# Patient Record
Sex: Female | Born: 1950 | Race: White | Hispanic: No | Marital: Married | Smoking: Former smoker
Health system: Southern US, Community
[De-identification: ages and names within clinical notes are randomized; demographics above are authoritative.]

## PROBLEM LIST (undated history)

## (undated) DIAGNOSIS — H25813 Combined forms of age-related cataract, bilateral: Secondary | ICD-10-CM

## (undated) DIAGNOSIS — R5383 Other fatigue: Secondary | ICD-10-CM

## (undated) DIAGNOSIS — R05 Cough: Secondary | ICD-10-CM

## (undated) DIAGNOSIS — H16403 Unspecified corneal neovascularization, bilateral: Secondary | ICD-10-CM

## (undated) DIAGNOSIS — I10 Essential (primary) hypertension: Secondary | ICD-10-CM

## (undated) DIAGNOSIS — H11043 Peripheral pterygium, stationary, bilateral: Secondary | ICD-10-CM

## (undated) DIAGNOSIS — R079 Chest pain, unspecified: Secondary | ICD-10-CM

## (undated) DIAGNOSIS — I7789 Other specified disorders of arteries and arterioles: Secondary | ICD-10-CM

## (undated) HISTORY — DX: Other fatigue: R53.83

## (undated) HISTORY — DX: Essential (primary) hypertension: I10

## (undated) HISTORY — DX: Combined forms of age-related cataract, bilateral: H25.813

## (undated) HISTORY — PX: CARPAL TUNNEL RELEASE: SHX101

## (undated) HISTORY — PX: TONSILLECTOMY: SUR1361

## (undated) HISTORY — DX: Peripheral pterygium, stationary, bilateral: H11.043

## (undated) HISTORY — DX: Chest pain, unspecified: R07.9

## (undated) HISTORY — PX: ABDOMINAL HYSTERECTOMY: SHX81

## (undated) HISTORY — DX: Other specified disorders of arteries and arterioles: I77.89

## (undated) HISTORY — DX: Cough: R05

## (undated) HISTORY — DX: Unspecified corneal neovascularization, bilateral: H16.403

## (undated) HISTORY — PX: SHOULDER SURGERY: SHX246

---

## 1998-07-13 ENCOUNTER — Encounter: Payer: Self-pay | Admitting: *Deleted

## 1998-07-13 ENCOUNTER — Ambulatory Visit (HOSPITAL_COMMUNITY): Admission: RE | Admit: 1998-07-13 | Discharge: 1998-07-13 | Payer: Self-pay | Admitting: *Deleted

## 2001-12-17 ENCOUNTER — Ambulatory Visit (HOSPITAL_BASED_OUTPATIENT_CLINIC_OR_DEPARTMENT_OTHER): Admission: RE | Admit: 2001-12-17 | Discharge: 2001-12-17 | Payer: Self-pay | Admitting: Orthopedic Surgery

## 2005-07-07 ENCOUNTER — Encounter: Admission: RE | Admit: 2005-07-07 | Discharge: 2005-07-07 | Payer: Self-pay | Admitting: Internal Medicine

## 2006-11-21 ENCOUNTER — Ambulatory Visit (HOSPITAL_BASED_OUTPATIENT_CLINIC_OR_DEPARTMENT_OTHER): Admission: RE | Admit: 2006-11-21 | Discharge: 2006-11-21 | Payer: Self-pay | Admitting: Orthopedic Surgery

## 2007-11-22 IMAGING — CT CT CHEST W/O CM
1 of 2 series · 14 of 31 positions shown, 18 images · IV contrast (omnipaque)
Comparison: 04/18/06.

PATIENT NAME:  BECKMANN, JEANCARLO
DOB:  [birth year]SITE MRN:  [id number]               GR MRN:  [id number]    
LOCATION:  [REDACTED]    

REFERRING PHYSICIAN:  DR WENDEE OUT
READ BY:  LOTHARIO BE                                          
cc:  CROW JIM[LIENAD]
CLINICAL DATA: Lymphoma, status post chemotherapy.  Chest pain and cough and weight loss.  
CT CHEST WITH CONTRAST
TECHNIQUE: Multidetector CT imaging of the chest was performed following the standard protocol during bolus administration of 100 mL Omnipaque 300 intravenous contrast. 
There are no previous available for comparison.
TECHNIQUE: Multidetector CT imaging of the abdomen was performed following the standard protocol during bolus administration of intravenous contrast.
TECHNIQUE: Multidetector CT imaging of the pelvis was performed following the standard protocol during bolus administration of intravenous contrast.

[Series 2: — · axial · 0.39mm/px · z∈[+1499,+1629]mm · 14 of 60 slices shown, 18 images]
[im 5/60  mediastinal]
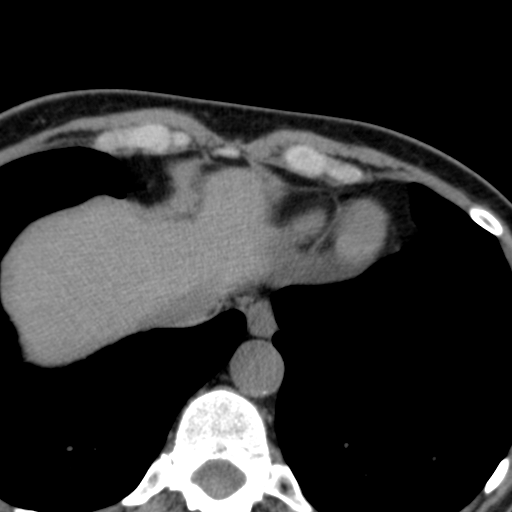
[im 5/60  lung]
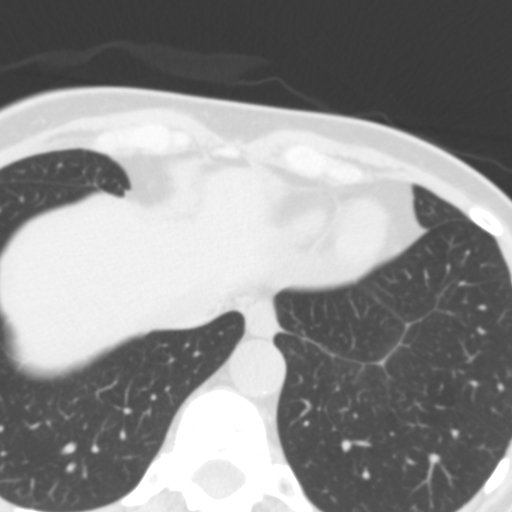
[im 10/60  lung]
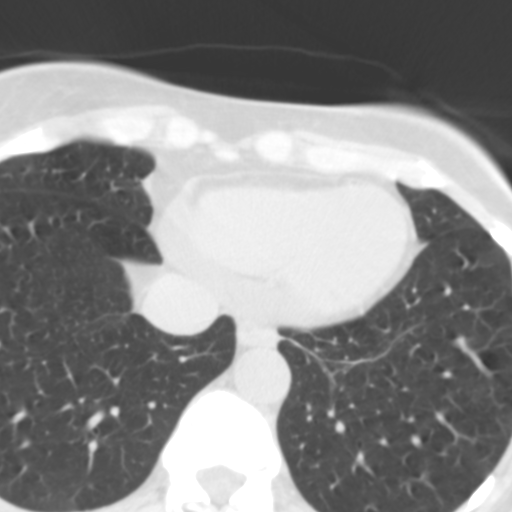
[im 14/60  lung]
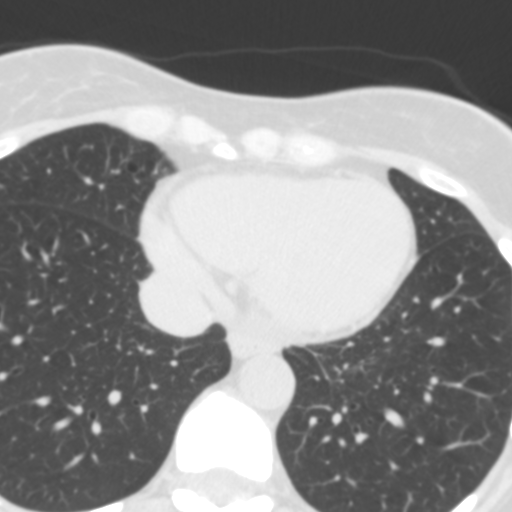
[im 16/60  lung]
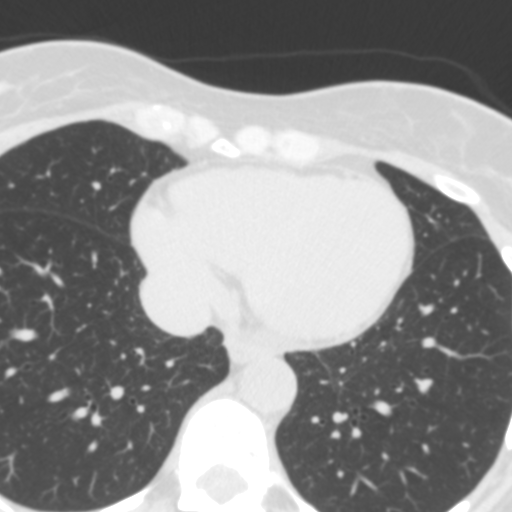
[im 21/60  mediastinal]
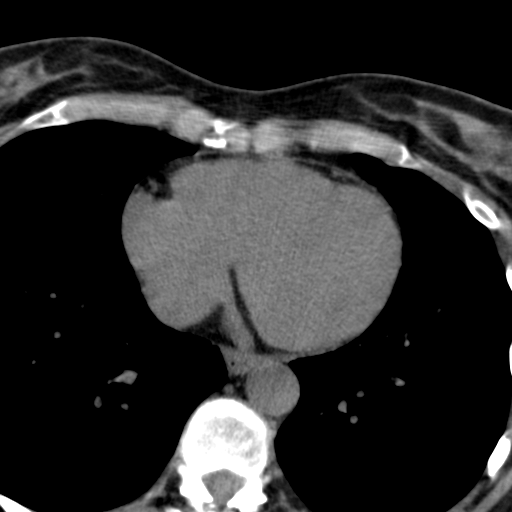
[im 21/60  lung]
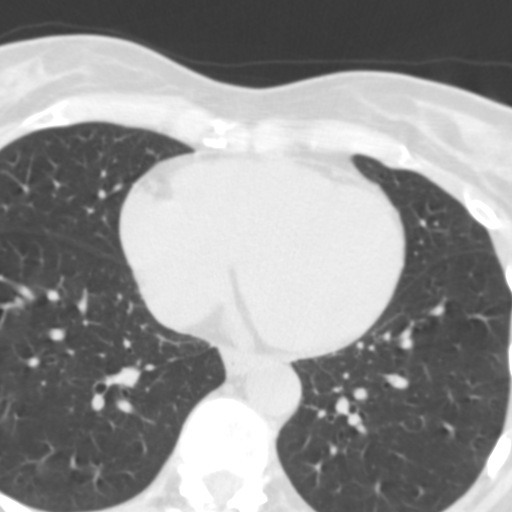
[im 25/60  lung]
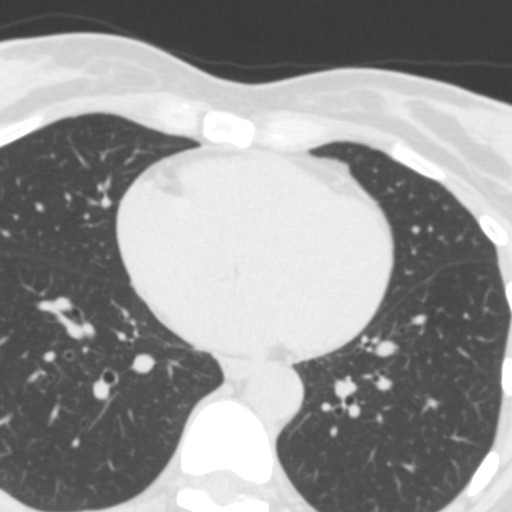
[im 28/60  lung]
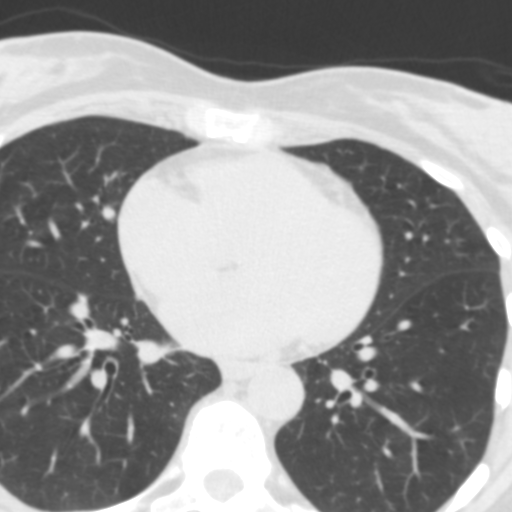
[im 32/60  lung]
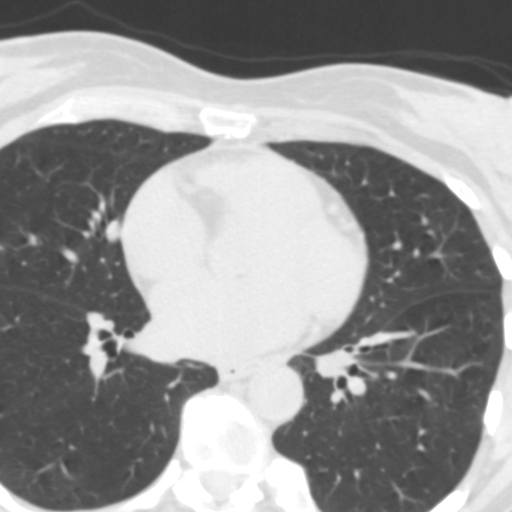
[im 37/60  mediastinal]
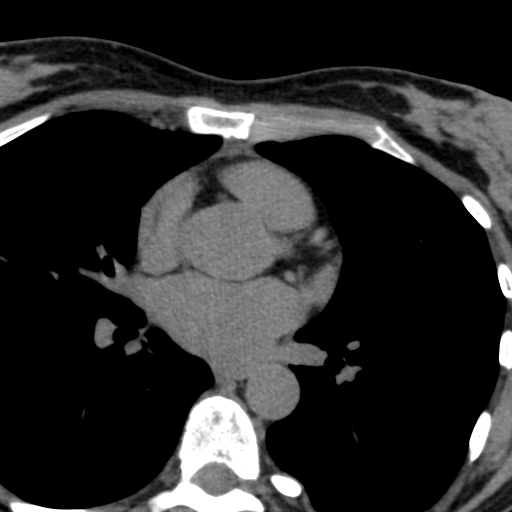
[im 37/60  lung]
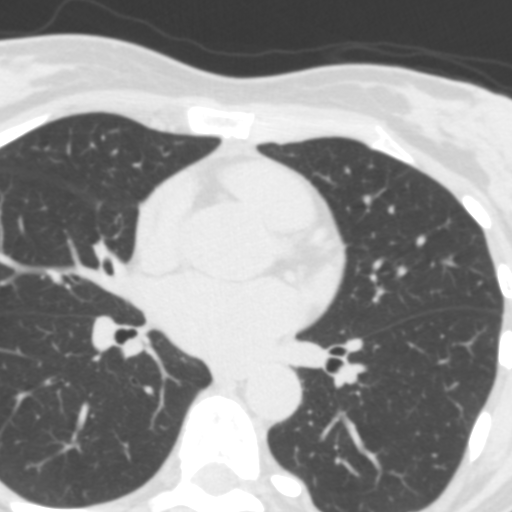
[im 41/60  lung]
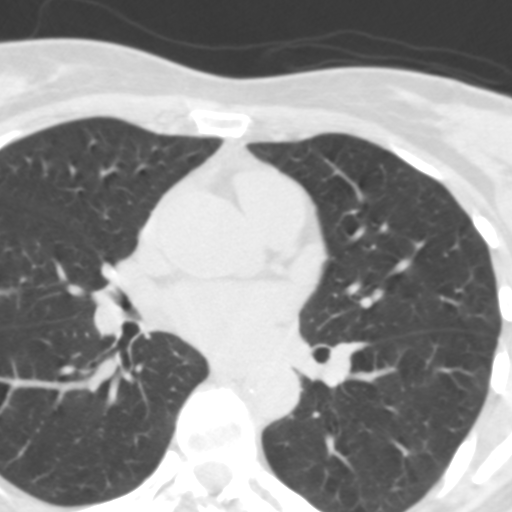
[im 45/60  lung]
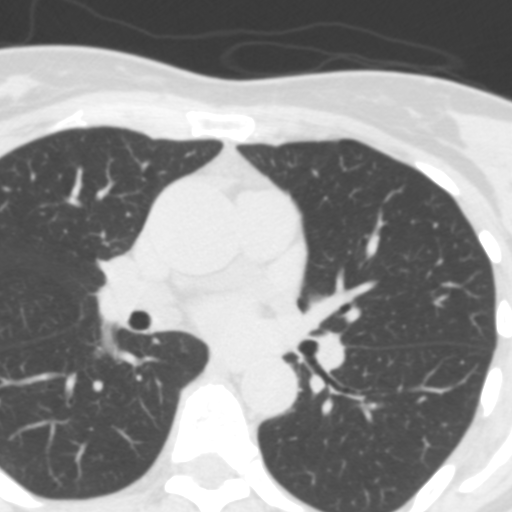
[im 48/60  lung]
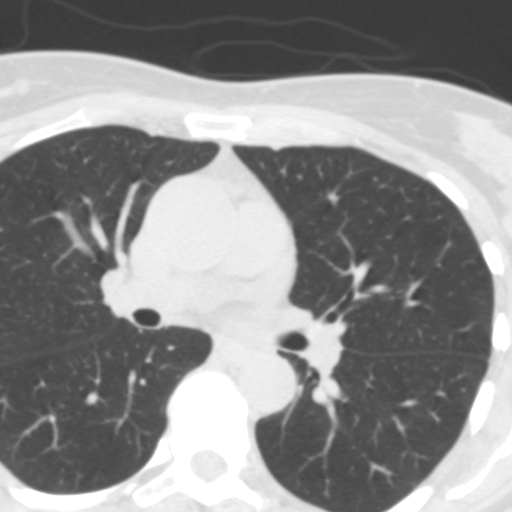
[im 53/60  mediastinal]
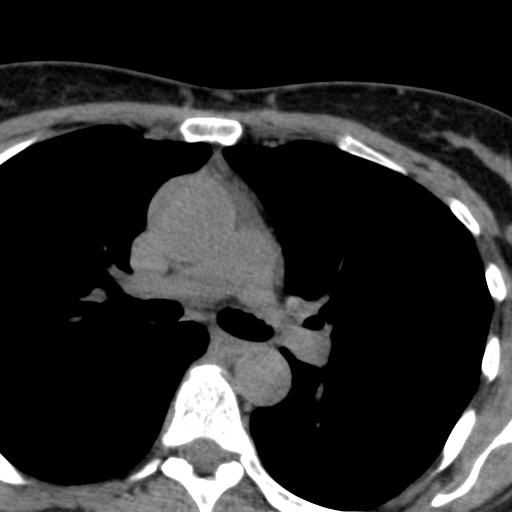
[im 53/60  lung]
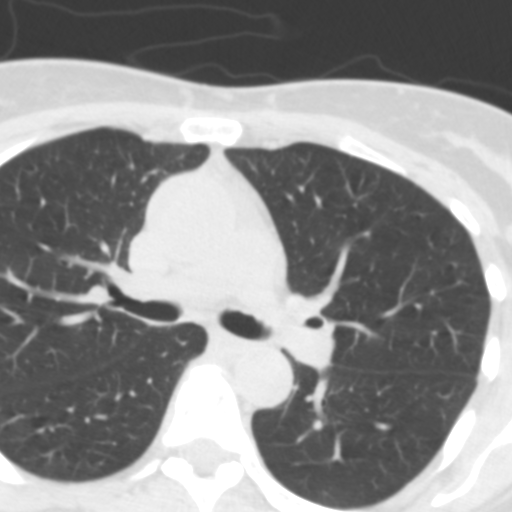
[im 57/60  lung]
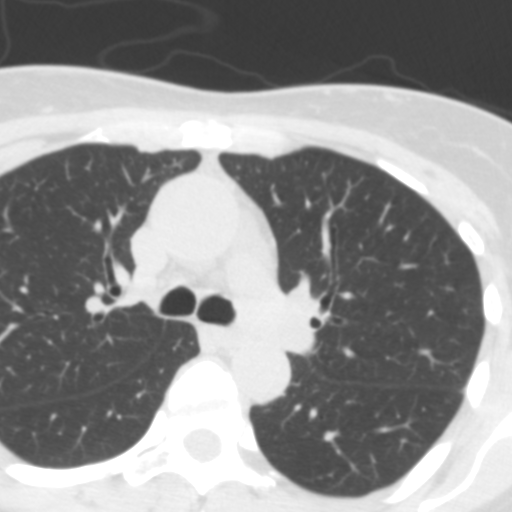

[14 of 31 positions shown; findings below may reference images not displayed]

FINDINGS: No pleural or pericardial effusion.  There is evidence of bilateral central and segmental pulmonary emboli.  Lungs are clear.  No hilar or mediastinal adenopathy.  Right IJ port catheter is in place.  No axillary adenopathy.
IMPRESSION: Central and segmental bilateral pulmonary emboli.  This was telephoned to Dr. [REDACTED] at the time of interpretation.  

CT ABDOMEN WITH CONTRAST
FINDINGS: There is bulky retroperitoneal adenopathy measuring approximately 6.4 x 6.5 cm, slightly decreased from the previous study.  Bulky left para-aortic adenopathy as well as adenopathy around the celiac axis is slightly decreased.  The adenopathy results in mass effect upon the IVC, draping and attenuation of the right renal artery, and narrowing of the central aspect of the left renal vein.  There is probable mural thrombus in the IVC below the level of the renal veins.  Subcentimeter low attenuation lesion in the liver near the falciform ligament is stable.  No new liver lesion.  Changes of cholecystectomy.  Unremarkable spleen, adrenal glands.  Prominent parapelvic left renal cysts again noted.  Small bowel remains decompressed.  No free air.  No ascites.  Portal and splenic veins patent.  There is a 12 x 18 mm soft tissue attenuation nodule in the right posterior perirenal fascial planes which is new since previous study.  Patchy, less well defined soft tissue attenuation is noted inferior to this level, also new since the prior study.
IMPRESSION: 1.Bulky retroperitoneal adenopathy, slightly decreased in overall size since previous scan.  
2.New soft tissue nodules in the right retroperitoneum which may represent progression of disease in this area.  

CT PELVIS WITH CONTRAST
FINDINGS: Left hip prosthesis results in some streak artifact.  Probable non-occlusive thrombus in the right common iliac vein.  This area was not as well opacified on the prior scan.  The right femoral vein is distended with low attenuation centrally, suggesting DVT.  The colon is decompressed, unremarkable.  No free fluid.  Urinary bladder incompletely distended.  Bilateral pelvic phleboliths.
IMPRESSION: 1.No pelvic adenopathy.  Stable appearance since previous scan.  
2.Probable DVT in the right femoral vein and separate thrombus in the right common iliac vein.  
[REDACTED]

[REDACTED]SCRIBED: 07-13-2006 [DATE]

DDH/[REDACTED]

## 2010-08-07 ENCOUNTER — Encounter: Payer: Self-pay | Admitting: Internal Medicine

## 2015-07-28 DIAGNOSIS — K219 Gastro-esophageal reflux disease without esophagitis: Secondary | ICD-10-CM | POA: Diagnosis not present

## 2015-07-28 DIAGNOSIS — R1310 Dysphagia, unspecified: Secondary | ICD-10-CM | POA: Diagnosis not present

## 2015-08-19 DIAGNOSIS — E039 Hypothyroidism, unspecified: Secondary | ICD-10-CM | POA: Diagnosis not present

## 2015-08-19 DIAGNOSIS — K5730 Diverticulosis of large intestine without perforation or abscess without bleeding: Secondary | ICD-10-CM | POA: Diagnosis not present

## 2015-08-19 DIAGNOSIS — Z1211 Encounter for screening for malignant neoplasm of colon: Secondary | ICD-10-CM | POA: Diagnosis not present

## 2015-08-19 DIAGNOSIS — Z800 Family history of malignant neoplasm of digestive organs: Secondary | ICD-10-CM | POA: Diagnosis not present

## 2015-08-19 DIAGNOSIS — R1310 Dysphagia, unspecified: Secondary | ICD-10-CM | POA: Diagnosis not present

## 2015-08-19 DIAGNOSIS — K219 Gastro-esophageal reflux disease without esophagitis: Secondary | ICD-10-CM | POA: Diagnosis not present

## 2015-08-19 DIAGNOSIS — I10 Essential (primary) hypertension: Secondary | ICD-10-CM | POA: Diagnosis not present

## 2015-08-19 DIAGNOSIS — K449 Diaphragmatic hernia without obstruction or gangrene: Secondary | ICD-10-CM | POA: Diagnosis not present

## 2015-08-19 DIAGNOSIS — Z79899 Other long term (current) drug therapy: Secondary | ICD-10-CM | POA: Diagnosis not present

## 2015-08-19 DIAGNOSIS — K208 Other esophagitis: Secondary | ICD-10-CM | POA: Diagnosis not present

## 2015-08-20 DIAGNOSIS — N951 Menopausal and female climacteric states: Secondary | ICD-10-CM | POA: Diagnosis not present

## 2015-09-27 DIAGNOSIS — L821 Other seborrheic keratosis: Secondary | ICD-10-CM | POA: Diagnosis not present

## 2015-09-27 DIAGNOSIS — L661 Lichen planopilaris: Secondary | ICD-10-CM | POA: Diagnosis not present

## 2015-09-27 DIAGNOSIS — L570 Actinic keratosis: Secondary | ICD-10-CM | POA: Diagnosis not present

## 2015-09-27 DIAGNOSIS — Z85828 Personal history of other malignant neoplasm of skin: Secondary | ICD-10-CM | POA: Diagnosis not present

## 2015-09-27 DIAGNOSIS — L814 Other melanin hyperpigmentation: Secondary | ICD-10-CM | POA: Diagnosis not present

## 2015-11-22 DIAGNOSIS — E049 Nontoxic goiter, unspecified: Secondary | ICD-10-CM | POA: Diagnosis not present

## 2015-11-25 DIAGNOSIS — R921 Mammographic calcification found on diagnostic imaging of breast: Secondary | ICD-10-CM | POA: Diagnosis not present

## 2015-11-25 DIAGNOSIS — R928 Other abnormal and inconclusive findings on diagnostic imaging of breast: Secondary | ICD-10-CM | POA: Diagnosis not present

## 2015-12-30 DIAGNOSIS — R079 Chest pain, unspecified: Secondary | ICD-10-CM

## 2015-12-30 DIAGNOSIS — R5383 Other fatigue: Secondary | ICD-10-CM

## 2015-12-30 DIAGNOSIS — R05 Cough: Secondary | ICD-10-CM | POA: Insufficient documentation

## 2015-12-30 DIAGNOSIS — R053 Chronic cough: Secondary | ICD-10-CM

## 2015-12-30 HISTORY — DX: Other fatigue: R53.83

## 2015-12-30 HISTORY — DX: Chronic cough: R05.3

## 2015-12-30 HISTORY — DX: Chest pain, unspecified: R07.9

## 2015-12-31 DIAGNOSIS — R05 Cough: Secondary | ICD-10-CM | POA: Diagnosis not present

## 2015-12-31 DIAGNOSIS — R918 Other nonspecific abnormal finding of lung field: Secondary | ICD-10-CM | POA: Diagnosis not present

## 2016-01-14 DIAGNOSIS — R918 Other nonspecific abnormal finding of lung field: Secondary | ICD-10-CM | POA: Diagnosis not present

## 2016-03-14 DIAGNOSIS — H5213 Myopia, bilateral: Secondary | ICD-10-CM | POA: Diagnosis not present

## 2016-03-14 DIAGNOSIS — H25013 Cortical age-related cataract, bilateral: Secondary | ICD-10-CM | POA: Diagnosis not present

## 2016-03-14 DIAGNOSIS — H11003 Unspecified pterygium of eye, bilateral: Secondary | ICD-10-CM | POA: Diagnosis not present

## 2016-03-29 DIAGNOSIS — Z01419 Encounter for gynecological examination (general) (routine) without abnormal findings: Secondary | ICD-10-CM | POA: Diagnosis not present

## 2016-03-30 DIAGNOSIS — L821 Other seborrheic keratosis: Secondary | ICD-10-CM | POA: Diagnosis not present

## 2016-03-30 DIAGNOSIS — L570 Actinic keratosis: Secondary | ICD-10-CM | POA: Diagnosis not present

## 2016-03-30 DIAGNOSIS — L814 Other melanin hyperpigmentation: Secondary | ICD-10-CM | POA: Diagnosis not present

## 2016-03-30 DIAGNOSIS — Z85828 Personal history of other malignant neoplasm of skin: Secondary | ICD-10-CM | POA: Diagnosis not present

## 2016-03-30 DIAGNOSIS — D485 Neoplasm of uncertain behavior of skin: Secondary | ICD-10-CM | POA: Diagnosis not present

## 2016-03-30 DIAGNOSIS — C4441 Basal cell carcinoma of skin of scalp and neck: Secondary | ICD-10-CM | POA: Diagnosis not present

## 2016-05-02 DIAGNOSIS — Z85828 Personal history of other malignant neoplasm of skin: Secondary | ICD-10-CM | POA: Diagnosis not present

## 2016-05-02 DIAGNOSIS — C44319 Basal cell carcinoma of skin of other parts of face: Secondary | ICD-10-CM | POA: Diagnosis not present

## 2016-05-23 DIAGNOSIS — R921 Mammographic calcification found on diagnostic imaging of breast: Secondary | ICD-10-CM | POA: Diagnosis not present

## 2016-06-12 DIAGNOSIS — R5383 Other fatigue: Secondary | ICD-10-CM | POA: Diagnosis not present

## 2016-06-12 DIAGNOSIS — I10 Essential (primary) hypertension: Secondary | ICD-10-CM | POA: Diagnosis not present

## 2016-06-12 DIAGNOSIS — E782 Mixed hyperlipidemia: Secondary | ICD-10-CM | POA: Diagnosis not present

## 2016-06-12 DIAGNOSIS — N951 Menopausal and female climacteric states: Secondary | ICD-10-CM | POA: Diagnosis not present

## 2016-08-16 DIAGNOSIS — E049 Nontoxic goiter, unspecified: Secondary | ICD-10-CM | POA: Diagnosis not present

## 2016-08-16 DIAGNOSIS — E042 Nontoxic multinodular goiter: Secondary | ICD-10-CM | POA: Diagnosis not present

## 2016-08-30 DIAGNOSIS — I7789 Other specified disorders of arteries and arterioles: Secondary | ICD-10-CM

## 2016-08-30 DIAGNOSIS — I10 Essential (primary) hypertension: Secondary | ICD-10-CM | POA: Insufficient documentation

## 2016-08-30 HISTORY — DX: Essential (primary) hypertension: I10

## 2016-08-30 HISTORY — DX: Other specified disorders of arteries and arterioles: I77.89

## 2016-08-31 DIAGNOSIS — I700 Atherosclerosis of aorta: Secondary | ICD-10-CM | POA: Diagnosis not present

## 2016-08-31 DIAGNOSIS — R079 Chest pain, unspecified: Secondary | ICD-10-CM | POA: Diagnosis not present

## 2016-08-31 DIAGNOSIS — I7789 Other specified disorders of arteries and arterioles: Secondary | ICD-10-CM | POA: Diagnosis not present

## 2016-08-31 DIAGNOSIS — I10 Essential (primary) hypertension: Secondary | ICD-10-CM | POA: Diagnosis not present

## 2016-09-13 DIAGNOSIS — R079 Chest pain, unspecified: Secondary | ICD-10-CM | POA: Diagnosis not present

## 2016-09-13 DIAGNOSIS — I10 Essential (primary) hypertension: Secondary | ICD-10-CM | POA: Diagnosis not present

## 2016-10-05 DIAGNOSIS — E042 Nontoxic multinodular goiter: Secondary | ICD-10-CM | POA: Diagnosis not present

## 2016-10-05 DIAGNOSIS — E782 Mixed hyperlipidemia: Secondary | ICD-10-CM | POA: Diagnosis not present

## 2016-10-05 DIAGNOSIS — D649 Anemia, unspecified: Secondary | ICD-10-CM | POA: Diagnosis not present

## 2016-10-05 DIAGNOSIS — R7309 Other abnormal glucose: Secondary | ICD-10-CM | POA: Diagnosis not present

## 2016-10-05 DIAGNOSIS — R5381 Other malaise: Secondary | ICD-10-CM | POA: Diagnosis not present

## 2016-10-05 DIAGNOSIS — N951 Menopausal and female climacteric states: Secondary | ICD-10-CM | POA: Diagnosis not present

## 2016-10-05 DIAGNOSIS — E039 Hypothyroidism, unspecified: Secondary | ICD-10-CM | POA: Diagnosis not present

## 2016-10-05 DIAGNOSIS — I10 Essential (primary) hypertension: Secondary | ICD-10-CM | POA: Diagnosis not present

## 2016-11-24 DIAGNOSIS — R5381 Other malaise: Secondary | ICD-10-CM | POA: Diagnosis not present

## 2016-11-24 DIAGNOSIS — E039 Hypothyroidism, unspecified: Secondary | ICD-10-CM | POA: Diagnosis not present

## 2017-01-25 DIAGNOSIS — E039 Hypothyroidism, unspecified: Secondary | ICD-10-CM | POA: Diagnosis not present

## 2017-01-25 DIAGNOSIS — E049 Nontoxic goiter, unspecified: Secondary | ICD-10-CM | POA: Diagnosis not present

## 2017-01-25 DIAGNOSIS — D539 Nutritional anemia, unspecified: Secondary | ICD-10-CM | POA: Diagnosis not present

## 2017-03-27 DIAGNOSIS — H2513 Age-related nuclear cataract, bilateral: Secondary | ICD-10-CM | POA: Diagnosis not present

## 2017-03-27 DIAGNOSIS — H5213 Myopia, bilateral: Secondary | ICD-10-CM | POA: Diagnosis not present

## 2017-03-27 DIAGNOSIS — H11003 Unspecified pterygium of eye, bilateral: Secondary | ICD-10-CM | POA: Diagnosis not present

## 2017-04-02 DIAGNOSIS — L821 Other seborrheic keratosis: Secondary | ICD-10-CM | POA: Diagnosis not present

## 2017-04-02 DIAGNOSIS — L570 Actinic keratosis: Secondary | ICD-10-CM | POA: Diagnosis not present

## 2017-04-02 DIAGNOSIS — D485 Neoplasm of uncertain behavior of skin: Secondary | ICD-10-CM | POA: Diagnosis not present

## 2017-04-02 DIAGNOSIS — L814 Other melanin hyperpigmentation: Secondary | ICD-10-CM | POA: Diagnosis not present

## 2017-04-02 DIAGNOSIS — C44212 Basal cell carcinoma of skin of right ear and external auricular canal: Secondary | ICD-10-CM | POA: Diagnosis not present

## 2017-04-02 DIAGNOSIS — Z85828 Personal history of other malignant neoplasm of skin: Secondary | ICD-10-CM | POA: Diagnosis not present

## 2017-04-02 DIAGNOSIS — L820 Inflamed seborrheic keratosis: Secondary | ICD-10-CM | POA: Diagnosis not present

## 2017-04-20 ENCOUNTER — Telehealth: Payer: Self-pay | Admitting: Cardiology

## 2017-04-20 NOTE — Telephone Encounter (Signed)
Patient needs to talk about her bp and the change in her BP meds by her PCP.  Please call her. She was taking Micarditis and now they have swtiched her to Ditalizem and she just wants to make sure that Dr. Bettina Gavia is good with that.

## 2017-04-20 NOTE — Telephone Encounter (Signed)
Spoke with Kathryn Drake and she states she thinks Diltiazem is causing her to cough, and has informed her PCP of this.  PCP advised she should consult with Dr Bettina Gavia.  Dr Bettina Gavia aware of medication changes, and does not believe Diltiazem will cause cough.  Kathryn Drake advised to continue with current medications, and if she feels the need for appt with Dr Bettina Gavia to discuss medications she can do so, but Kathryn Drake states she will see PCP in a few weeks and discuss at that time.    Kathryn Drake will flup with Dr Bettina Gavia in Feb 2019 as planned. Recall was added.  Kathryn Drake verbalized understanding.

## 2017-04-20 NOTE — Telephone Encounter (Signed)
Pt was last seen by you in [place].  Should we just schedule her an appt for our new location to discuss this?  Please advise

## 2017-04-24 DIAGNOSIS — H11043 Peripheral pterygium, stationary, bilateral: Secondary | ICD-10-CM | POA: Diagnosis not present

## 2017-04-24 DIAGNOSIS — H16403 Unspecified corneal neovascularization, bilateral: Secondary | ICD-10-CM | POA: Diagnosis not present

## 2017-04-24 DIAGNOSIS — H25813 Combined forms of age-related cataract, bilateral: Secondary | ICD-10-CM | POA: Diagnosis not present

## 2017-04-25 DIAGNOSIS — H11043 Peripheral pterygium, stationary, bilateral: Secondary | ICD-10-CM

## 2017-04-25 DIAGNOSIS — H25813 Combined forms of age-related cataract, bilateral: Secondary | ICD-10-CM

## 2017-04-25 DIAGNOSIS — H16403 Unspecified corneal neovascularization, bilateral: Secondary | ICD-10-CM

## 2017-04-25 HISTORY — DX: Combined forms of age-related cataract, bilateral: H25.813

## 2017-04-25 HISTORY — DX: Peripheral pterygium, stationary, bilateral: H11.043

## 2017-04-25 HISTORY — DX: Unspecified corneal neovascularization, bilateral: H16.403

## 2017-05-02 DIAGNOSIS — Z85828 Personal history of other malignant neoplasm of skin: Secondary | ICD-10-CM | POA: Diagnosis not present

## 2017-05-02 DIAGNOSIS — C44212 Basal cell carcinoma of skin of right ear and external auricular canal: Secondary | ICD-10-CM | POA: Diagnosis not present

## 2017-05-25 DIAGNOSIS — E782 Mixed hyperlipidemia: Secondary | ICD-10-CM | POA: Diagnosis not present

## 2017-05-25 DIAGNOSIS — I10 Essential (primary) hypertension: Secondary | ICD-10-CM | POA: Diagnosis not present

## 2017-05-25 DIAGNOSIS — E049 Nontoxic goiter, unspecified: Secondary | ICD-10-CM | POA: Diagnosis not present

## 2017-05-25 DIAGNOSIS — N951 Menopausal and female climacteric states: Secondary | ICD-10-CM | POA: Diagnosis not present

## 2017-05-28 ENCOUNTER — Other Ambulatory Visit: Payer: Self-pay

## 2017-05-31 ENCOUNTER — Ambulatory Visit (INDEPENDENT_AMBULATORY_CARE_PROVIDER_SITE_OTHER): Payer: PPO | Admitting: Cardiology

## 2017-05-31 ENCOUNTER — Encounter: Payer: Self-pay | Admitting: Cardiology

## 2017-05-31 VITALS — BP 150/92 | HR 92 | Ht 61.0 in | Wt 110.0 lb

## 2017-05-31 DIAGNOSIS — I10 Essential (primary) hypertension: Secondary | ICD-10-CM | POA: Diagnosis not present

## 2017-05-31 MED ORDER — SPIRONOLACTONE 25 MG PO TABS: 25.0000 mg | ORAL_TABLET | Freq: Every day | ORAL | 3 refills | Status: DC

## 2017-05-31 NOTE — Progress Notes (Signed)
Cardiology Office Note:    Date:  05/31/2017   ID:  Kathryn Drake, DOB [birth year], MRN [id number]  PCP:  Karle Starch, MD  Cardiologist:  Shirlee More, MD    Referring MD: Karle Starch    ASSESSMENT:    1. Essential hypertension    PLAN:    In order of problems listed above:  1. Unfortunately not well controlled and intolerant of ARB with the unusual side effect of cough. I asked her to stop her ARB and will place her on spironolactone as an antihypertensive. 1 week she'll check renal function regarding hyperkalemia. She'll call my office in 3 weeks if systolics are less than 7:90 I think she could use monotherapy and stop the calcium channel blocker which has not been very effective. If she reaches target and tolerates a single agent I'll see back in my office as needed.   Next appointment: As needed   Medication Adjustments/Labs and Tests Ordered: Current medicines are reviewed at length with the patient today.  Concerns regarding medicines are outlined above.  No orders of the defined types were placed in this encounter.  No orders of the defined types were placed in this encounter.   Chief Complaint  Patient presents with  . Follow-up    questions about BP   . Hypertension    and side effects of her BP medication    History of Present Illness:    Kathryn Drake is a 66 y.o. female with a hx of HTN, Chest Pain, and an abnormal CT of the chest in 2013 mild enlargement of the ascending aorta 38 mm subsequently normal on FU CT in 2017  last seen in February 2018. Compliance with diet, lifestyle and medications: Yes Stewart is frustrated because her blood pressure is requiring 2 medications for control. She is taken ARB and unfortunately has developed a cough which isn't uncommon side effect. She is placed on a calcium channel blocker and she has not reached target. I reviewed with her that increasing frequency of hypertension with aging and stiff  vasculature and that she has essential hypertension. She accepts the need for treatment. She would prefer not to take beta blocker and ace and arm are no longer options. Often individuals responsive presently well to blockade a mineralocorticoid hormone and I'll place her on spironolactone 25 mg daily will check renal function one week for potassium and I asked her to call my office in 3 weeks with results. For reproducibility should check her blood pressure at rest once daily and for systolics are less than 2:40 and think she will be able to stop the calcium channel blocker. She is adding salt to her diet I asked her to stop she is not overweight and she'll continue activity. I've left things that if her blood pressures not control of her back to my office and otherwise she'll follow-up with her PCP that she has a great deal of confidence with. She requested the beginning of the visit just to speak to me and she was not examined more than to check her blood pressure. Past Medical History:  Diagnosis Date  . Chest pain 12/30/2015  . Chronic cough 12/30/2015  . 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 Enlarged thoracic aorta ([place]) 08/30/2016   Overview:  33 mm ascending aorta diameter 12/18/15  . Essential hypertension 08/30/2016  . Fatigue 12/30/2015  . Pterygium, peripheral stationary, bilateral 04/25/2017  Past Surgical History:  Procedure Laterality Date  . ABDOMINAL HYSTERECTOMY    . CARPAL TUNNEL RELEASE    . SHOULDER SURGERY    . TONSILLECTOMY      Current Medications: Current Meds  Medication Sig  . telmisartan (MICARDIS) 20 MG tablet Take 1 tablet daily by mouth.  . thyroid (ARMOUR THYROID) 60 MG tablet Take 1 tablet daily by mouth.     Allergies:   Gluten meal   Social History   Socioeconomic History  . Marital status: Married    Spouse name: Not on file  . Number of children: Not on file  . Years of  education: Not on file  . Highest education level: Not on file  Social Needs  . Financial resource strain: Not on file  . Food insecurity - worry: Not on file  . Food insecurity - inability: Not on file  . Transportation needs - medical: Not on file  . Transportation needs - non-medical: Not on file  Occupational History  . Not on file  Tobacco Use  . Smoking status: Former Research scientist (life sciences)  . Smokeless tobacco: Never Used  Substance and Sexual Activity  . Alcohol use: Yes  . Drug use: No  . Sexual activity: Not on file  Other Topics Concern  . Not on file  Social History Narrative  . Not on file     Family History: The patient's family history includes Diabetes in her father; Heart attack in her father and paternal grandfather. ROS:   Please see the history of present illness.    All other systems reviewed and are negative.  EKGs/Labs/Other Studies Reviewed:    The following studies were reviewed today:   Recent Labs: No results found for requested labs within last 8760 hours.  Recent Lipid Panel No results found for: CHOL, TRIG, HDL, CHOLHDL, VLDL, LDLCALC, LDLDIRECT  Physical Exam:    VS:  There were no vitals taken for this visit.    Wt Readings from Last 3 Encounters:  No data found for Wt     GEN:  Well nourished, well developed in no acute distress NEUROLOGIC:  Alert and oriented x 3 PSYCHIATRIC:  Normal affect    Signed, Shirlee More, MD  05/31/2017 3:25 PM    [facility]

## 2017-05-31 NOTE — Patient Instructions (Signed)
Medication Instructions:  Your physician has recommended you make the following change in your medication:  STOP telmisartan START spirinolactone 25 mg daily  Labwork: Your physician recommends that you return for lab work in: 1 week, BMP, [place]  Testing/Procedures: None  Follow-Up: Your physician recommends that you schedule a follow-up appointment as needed if symptoms worsen or fail to improve.  Any Other Special Instructions Will Be Listed Below (If Applicable).     If you need a refill on your cardiac medications before your next appointment, please call your pharmacy.

## 2017-06-13 ENCOUNTER — Telehealth: Payer: Self-pay

## 2017-06-13 MED ORDER — DILTIAZEM HCL ER COATED BEADS 180 MG PO CP24: 180.0000 mg | ORAL_CAPSULE | Freq: Every day | ORAL | 0 refills | Status: DC

## 2017-06-13 NOTE — Telephone Encounter (Signed)
Unfortunately it will recur as they are the same chemical class. Lets add hydralazine 12.5 mg TID

## 2017-06-13 NOTE — Telephone Encounter (Signed)
Left message to return call 

## 2017-06-13 NOTE — Telephone Encounter (Signed)
Left message to return call with husband regarding if patient went to [facility] for BMP.

## 2017-06-13 NOTE — Telephone Encounter (Signed)
Patient states she is taking diltiazem 180 mg daily and spironolactone 25 mg daily. BP 150's about every day for the past few days.   11/26 6:45 am 156/102 Pulse: 93  7:15 am 154/103 Pulse: 84 10 pm 156/96 Pulse: 79 11/27 157/108 Pulse: 90  Patient was on telmisartan, caused a cough. Patient wondering about losartan. She knows some people that take losartan and have had success. She discussed it with her PCP and PCP stated to consult with Cardiology and if recommended take the lowest dose.  Patient is going to have lab work at [facility] in [place] tomorrow morning.  Please advise if patient could start losartan to help with blood pressure.

## 2017-06-14 MED ORDER — HYDRALAZINE HCL 25 MG PO TABS: 12.5000 mg | ORAL_TABLET | Freq: Three times a day (TID) | ORAL | 3 refills | Status: DC

## 2017-06-14 NOTE — Addendum Note (Signed)
Addended by: Jossie Ng on: 06/14/2017 08:44 AM   Modules accepted: Orders

## 2017-06-14 NOTE — Telephone Encounter (Signed)
Patient advised that losartan may cause the cough as telmisartan did due to the same chemical class. Patient advised to start hydralazine 12.5 mg three times daily. Patient verbalized understanding. Prescription sent to [facility] in [place] per patient request. No further questions.

## 2017-06-15 DIAGNOSIS — I10 Essential (primary) hypertension: Secondary | ICD-10-CM | POA: Diagnosis not present

## 2017-06-15 LAB — BASIC METABOLIC PANEL
BUN/Creatinine Ratio: 19 (ref 12–28)
BUN: 15 mg/dL (ref 8–27)
CALCIUM: 10.9 mg/dL — AB (ref 8.7–10.3)
CHLORIDE: 101 mmol/L (ref 96–106)
CO2: 24 mmol/L (ref 20–29)
Creatinine, Ser: 0.78 mg/dL (ref 0.57–1.00)
GFR calc non Af Amer: 79 mL/min/{1.73_m2} (ref >59)
GFR, EST AFRICAN AMERICAN: 91 mL/min/{1.73_m2} (ref >59)
Glucose: 114 mg/dL — ABNORMAL HIGH (ref 65–99)
POTASSIUM: 3.9 mmol/L (ref 3.5–5.2)
Sodium: 141 mmol/L (ref 134–144)

## 2017-06-18 ENCOUNTER — Other Ambulatory Visit: Payer: Self-pay | Admitting: Cardiology

## 2017-06-28 ENCOUNTER — Telehealth: Payer: Self-pay

## 2017-06-28 NOTE — Telephone Encounter (Signed)
Contacted patient regarding blood pressures after starting hydralazine 12.5 mg three times daily. Patient states that she started the hydralazine, but it made her feel like she was beginning to get a bladder infection. Patient stopped taking the hydralazine herself. Patient is still taking diltiazem and spironolactone.  Patient states that she does not have her list of blood pressures because she went to her PCP this morning and give the copy to him. Patient states that this morning when she woke up her blood pressure was 140s/100s, but when she left the PCP her blood pressure was 130/72 with a heart rate of 74.  Advised patient of lab work results. Advised patient that calcium is elevated. Advised patient that Dr. Bettina Gavia would like for patient to have another calcium and PTH check. Patient will go to [facility] in [place] tomorrow morning for lab check.  Patient verbalized understanding of lab work tomorrow morning. No further questions.

## 2017-06-30 LAB — PTH, INTACT AND CALCIUM
Calcium: 9.6 mg/dL (ref 8.7–10.3)
PTH: 34 pg/mL (ref 15–65)

## 2017-07-26 DIAGNOSIS — R102 Pelvic and perineal pain: Secondary | ICD-10-CM | POA: Diagnosis not present

## 2017-08-02 DIAGNOSIS — E782 Mixed hyperlipidemia: Secondary | ICD-10-CM | POA: Diagnosis not present

## 2017-08-02 DIAGNOSIS — I10 Essential (primary) hypertension: Secondary | ICD-10-CM | POA: Diagnosis not present

## 2017-08-02 DIAGNOSIS — E049 Nontoxic goiter, unspecified: Secondary | ICD-10-CM | POA: Diagnosis not present

## 2017-08-02 DIAGNOSIS — E039 Hypothyroidism, unspecified: Secondary | ICD-10-CM | POA: Diagnosis not present

## 2017-08-02 DIAGNOSIS — M899 Disorder of bone, unspecified: Secondary | ICD-10-CM | POA: Diagnosis not present

## 2017-08-02 DIAGNOSIS — R5381 Other malaise: Secondary | ICD-10-CM | POA: Diagnosis not present

## 2017-08-02 DIAGNOSIS — N951 Menopausal and female climacteric states: Secondary | ICD-10-CM | POA: Diagnosis not present

## 2017-08-09 DIAGNOSIS — R109 Unspecified abdominal pain: Secondary | ICD-10-CM | POA: Diagnosis not present

## 2017-08-13 DIAGNOSIS — K869 Disease of pancreas, unspecified: Secondary | ICD-10-CM | POA: Diagnosis not present

## 2017-08-14 DIAGNOSIS — K5710 Diverticulosis of small intestine without perforation or abscess without bleeding: Secondary | ICD-10-CM | POA: Diagnosis not present

## 2017-08-14 DIAGNOSIS — K869 Disease of pancreas, unspecified: Secondary | ICD-10-CM | POA: Diagnosis not present

## 2017-09-21 ENCOUNTER — Other Ambulatory Visit: Payer: Self-pay

## 2017-09-21 DIAGNOSIS — I10 Essential (primary) hypertension: Secondary | ICD-10-CM

## 2017-09-21 MED ORDER — SPIRONOLACTONE 25 MG PO TABS: 25.0000 mg | ORAL_TABLET | Freq: Every day | ORAL | 3 refills | Status: DC

## 2017-11-08 DIAGNOSIS — H11043 Peripheral pterygium, stationary, bilateral: Secondary | ICD-10-CM | POA: Diagnosis not present

## 2017-11-08 DIAGNOSIS — H16409 Unspecified corneal neovascularization, unspecified eye: Secondary | ICD-10-CM | POA: Diagnosis not present

## 2017-11-08 DIAGNOSIS — H25813 Combined forms of age-related cataract, bilateral: Secondary | ICD-10-CM | POA: Diagnosis not present

## 2017-11-08 DIAGNOSIS — H16403 Unspecified corneal neovascularization, bilateral: Secondary | ICD-10-CM | POA: Diagnosis not present

## 2017-11-30 DIAGNOSIS — I10 Essential (primary) hypertension: Secondary | ICD-10-CM | POA: Diagnosis not present

## 2017-11-30 DIAGNOSIS — E782 Mixed hyperlipidemia: Secondary | ICD-10-CM | POA: Diagnosis not present

## 2017-11-30 DIAGNOSIS — R5381 Other malaise: Secondary | ICD-10-CM | POA: Diagnosis not present

## 2017-12-27 ENCOUNTER — Telehealth: Payer: Self-pay

## 2017-12-27 DIAGNOSIS — E782 Mixed hyperlipidemia: Secondary | ICD-10-CM

## 2017-12-27 NOTE — Telephone Encounter (Signed)
-----   Message from Richardo Priest, MD sent at 12/27/2017  2:11 PM EDT ----- I would advise a statin, pravastatin 20 mg day and recheck lipid cmp 1 month  OK to wait for office FU

## 2017-12-27 NOTE — Telephone Encounter (Signed)
Left message to return call 

## 2018-01-01 MED ORDER — PRAVASTATIN SODIUM 20 MG PO TABS: 20.0000 mg | ORAL_TABLET | Freq: Every evening | ORAL | 11 refills | Status: DC

## 2018-01-16 ENCOUNTER — Ambulatory Visit: Payer: PPO | Admitting: Cardiology

## 2018-02-01 ENCOUNTER — Encounter: Payer: Self-pay | Admitting: Cardiology

## 2018-02-01 ENCOUNTER — Ambulatory Visit (INDEPENDENT_AMBULATORY_CARE_PROVIDER_SITE_OTHER): Payer: PPO | Admitting: Cardiology

## 2018-02-01 VITALS — BP 142/78 | HR 110 | Ht 61.0 in | Wt 103.6 lb

## 2018-02-01 DIAGNOSIS — E785 Hyperlipidemia, unspecified: Secondary | ICD-10-CM | POA: Insufficient documentation

## 2018-02-01 DIAGNOSIS — I10 Essential (primary) hypertension: Secondary | ICD-10-CM | POA: Diagnosis not present

## 2018-02-01 DIAGNOSIS — E7800 Pure hypercholesterolemia, unspecified: Secondary | ICD-10-CM | POA: Diagnosis not present

## 2018-02-01 HISTORY — DX: Hyperlipidemia, unspecified: E78.5

## 2018-02-01 NOTE — Patient Instructions (Addendum)
Medication Instructions:  Your physician recommends that you continue on your current medications as directed. Please refer to the Current Medication list given to you today.   Labwork: Your physician recommends that you have labs drawn on Apr 16, 2018.   Testing/Procedures: NONE  Follow-Up: Your physician wants you to follow-up in: 3 months.   You will receive a reminder letter in the mail two months in advance. If you don't receive a letter, please call our office to schedule the follow-up appointment.   Any Other Special Instructions Will Be Listed Below (If Applicable).     If you need a refill on your cardiac medications before your next appointment, please call your pharmacy.

## 2018-02-01 NOTE — Progress Notes (Signed)
Cardiology Office Note:    Date:  02/01/2018   ID:  Kathryn Drake, DOB Feb 27, 1951, MRN [id number]  PCP:  Karle Starch, MD  Cardiologist:  Shirlee More, MD    Referring MD: Karle Starch    ASSESSMENT:    1. Pure hypercholesterolemia   2. Essential hypertension    PLAN:    In order of problems listed above:  1. A combination of elevated LDL cholesterol and highest sensitivity CRP are additive and cardiovascular risk after discussion treatment options will use red rice yeast co-Q10 reassess in 3 months regarding the need for additional or prescription statin treatment treatment. 2. Stable continue current treatment diltiazem ARB   Next appointment: 3 months   Medication Adjustments/Labs and Tests Ordered: Current medicines are reviewed at length with the patient today.  Concerns regarding medicines are outlined above.  Orders Placed This Encounter  Procedures  . NMR, lipoprofile  . Lipoprotein A (LPA)  . CRP High sensitivity   No orders of the defined types were placed in this encounter.   No chief complaint on file.   History of Present Illness:    Kathryn Drake is a 67 y.o. female with a hx of hypertension chest pain abnormal CT of the chest 2013 mild enlargement is seen at descending aorta 38 mm subsequent normal on follow-up CT in 2017 last seen 05/31/17.  She is referred after recent laboratory studies show cholesterol 238 HDL 73 LDL 142 and non-HDL 165 Compliance with diet, lifestyle and medications: Yes  Recent labs show both elevated cholesterol LDL and CRP.  I advised her to take pravastatin she researched decided to use over-the-counter red rice yeast and CoQ10.  Although religious plan at the end of 3 months to have advanced lipid profile recheck a high-sensitivity CRP and LP(a) and reassess the efficacy of treatment Past Medical History:  Diagnosis Date  . Chest pain 12/30/2015  . Chronic cough 12/30/2015  . Combined form of senile  cataract of both eyes 04/25/2017  . 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 Enlarged thoracic aorta ([place]) 08/30/2016   Overview:  33 mm ascending aorta diameter 12/18/15  . Essential hypertension 08/30/2016  . Fatigue 12/30/2015  . Pterygium, peripheral stationary, bilateral 04/25/2017    Past Surgical History:  Procedure Laterality Date  . ABDOMINAL HYSTERECTOMY    . CARPAL TUNNEL RELEASE    . SHOULDER SURGERY    . TONSILLECTOMY      Current Medications: Current Meds  Medication Sig  . Ascorbic Acid (VITAMIN C PO) Take by mouth.  . Biotin 5 MG CAPS Take 5 mg daily by mouth.  . co-enzyme Q-10 50 MG capsule Take 50 mg daily by mouth.  . CRANBERRY PO Take 1 tablet daily by mouth.  . diltiazem (CARDIZEM CD) 180 MG 24 hr capsule Take 1 capsule (180 mg total) by mouth daily.  . diphenhydramine-acetaminophen (TYLENOL PM) 25-500 MG TABS tablet Take 1 tablet daily as needed by mouth.  . estradiol (ESTRACE) 1 MG tablet Take 1 mg daily by mouth.  . hydrALAZINE (APRESOLINE) 25 MG tablet Take 0.5 tablets (12.5 mg total) by mouth 3 (three) times daily.  Marland Kitchen losartan (COZAAR) 25 MG tablet Take 25 mg by mouth daily.  Marland Kitchen MAGNESIUM PO Take 30 mg daily by mouth.  . Multiple Vitamins-Minerals (MULTIVITAMIN ADULT PO) Take by mouth.  . omega-3 acid ethyl esters (LOVAZA) 1 g capsule Take 1 g daily by mouth.  . Omega-3 Fatty Acids (FISH OIL) 1000 MG CAPS  Take 1 capsule by mouth daily.  . pantoprazole (PROTONIX) 40 MG tablet Take 40 mg daily by mouth.  . progesterone (PROMETRIUM) 100 MG capsule Take 100 mg daily by mouth.  . Quercetin 250 MG TABS Take 250 mg daily by mouth.  . thyroid (ARMOUR THYROID) 60 MG tablet Take 1 tablet daily by mouth.  . valACYclovir (VALTREX) 1000 MG tablet Take 1 g daily as needed by mouth.     Allergies:   Gluten meal   Social History   Socioeconomic History  . Marital status: Married    Spouse name: Not on file  . Number of children: Not on file  . Years  of education: Not on file  . Highest education level: Not on file  Occupational History  . Not on file  Social Needs  . Financial resource strain: Not on file  . Food insecurity:    Worry: Not on file    Inability: Not on file  . Transportation needs:    Medical: Not on file    Non-medical: Not on file  Tobacco Use  . Smoking status: Former Research scientist (life sciences)  . Smokeless tobacco: Never Used  Substance and Sexual Activity  . Alcohol use: Yes  . Drug use: No  . Sexual activity: Not on file  Lifestyle  . Physical activity:    Days per week: Not on file    Minutes per session: Not on file  . Stress: Not on file  Relationships  . Social connections:    Talks on phone: Not on file    Gets together: Not on file    Attends religious service: Not on file    Active member of club or organization: Not on file    Attends meetings of clubs or organizations: Not on file    Relationship status: Not on file  Other Topics Concern  . Not on file  Social History Narrative  . Not on file     Family History: The patient's family history includes Diabetes in her father; Heart attack in her father and paternal grandfather. ROS:   Please see the history of present illness.    All other systems reviewed and are negative.  EKGs/Labs/Other Studies Reviewed:    The following studies were reviewed today:    Recent Labs: 06/15/2017: BUN 15; Creatinine, Ser 0.78; Potassium 3.9; Sodium 141  Recent Lipid Panel No results found for: CHOL, TRIG, HDL, CHOLHDL, VLDL, LDLCALC, LDLDIRECT  Physical Exam:    VS:  BP (!) 142/78 (BP Location: Right Arm, Patient Position: Sitting, Cuff Size: Normal)   Pulse (!) 110   Ht 5\' 1"  (1.549 m)   Wt 103 lb 9.6 oz (47 kg)   SpO2 98%   BMI 19.58 kg/m     Wt Readings from Last 3 Encounters:  02/01/18 103 lb 9.6 oz (47 kg)  05/31/17 110 lb (49.9 kg)     GEN:  Well nourished, well developed in no acute distress HEENT: Normal NECK: No JVD; No carotid  bruits LYMPHATICS: No lymphadenopathy CARDIAC: RRR, no murmurs, rubs, gallops RESPIRATORY:  Clear to auscultation without rales, wheezing or rhonchi  ABDOMEN: Soft, non-tender, non-distended MUSCULOSKELETAL:  No edema; No deformity  SKIN: Warm and dry NEUROLOGIC:  Alert and oriented x 3 PSYCHIATRIC:  Normal affect    Signed, Shirlee More, MD  02/01/2018 2:13 PM    [facility]

## 2018-02-01 NOTE — Addendum Note (Signed)
Addended by: Stevan Born on: 02/01/2018 02:17 PM   Modules accepted: Orders

## 2018-03-12 DIAGNOSIS — N951 Menopausal and female climacteric states: Secondary | ICD-10-CM | POA: Diagnosis not present

## 2018-03-12 DIAGNOSIS — E049 Nontoxic goiter, unspecified: Secondary | ICD-10-CM | POA: Diagnosis not present

## 2018-03-12 DIAGNOSIS — R7309 Other abnormal glucose: Secondary | ICD-10-CM | POA: Diagnosis not present

## 2018-03-12 DIAGNOSIS — R5381 Other malaise: Secondary | ICD-10-CM | POA: Diagnosis not present

## 2018-03-12 DIAGNOSIS — I10 Essential (primary) hypertension: Secondary | ICD-10-CM | POA: Diagnosis not present

## 2018-04-01 DIAGNOSIS — L570 Actinic keratosis: Secondary | ICD-10-CM | POA: Diagnosis not present

## 2018-04-01 DIAGNOSIS — L821 Other seborrheic keratosis: Secondary | ICD-10-CM | POA: Diagnosis not present

## 2018-04-01 DIAGNOSIS — Z85828 Personal history of other malignant neoplasm of skin: Secondary | ICD-10-CM | POA: Diagnosis not present

## 2018-05-20 DIAGNOSIS — Z6820 Body mass index (BMI) 20.0-20.9, adult: Secondary | ICD-10-CM | POA: Diagnosis not present

## 2018-05-20 DIAGNOSIS — Z1339 Encounter for screening examination for other mental health and behavioral disorders: Secondary | ICD-10-CM | POA: Diagnosis not present

## 2018-05-20 DIAGNOSIS — Z1331 Encounter for screening for depression: Secondary | ICD-10-CM | POA: Diagnosis not present

## 2018-05-20 DIAGNOSIS — Z9181 History of falling: Secondary | ICD-10-CM | POA: Diagnosis not present

## 2018-05-20 DIAGNOSIS — R1084 Generalized abdominal pain: Secondary | ICD-10-CM | POA: Diagnosis not present

## 2018-05-20 DIAGNOSIS — R05 Cough: Secondary | ICD-10-CM | POA: Diagnosis not present

## 2018-05-20 DIAGNOSIS — I10 Essential (primary) hypertension: Secondary | ICD-10-CM | POA: Diagnosis not present

## 2018-05-20 DIAGNOSIS — Z2821 Immunization not carried out because of patient refusal: Secondary | ICD-10-CM | POA: Diagnosis not present

## 2018-05-23 ENCOUNTER — Ambulatory Visit (INDEPENDENT_AMBULATORY_CARE_PROVIDER_SITE_OTHER): Payer: PPO | Admitting: Cardiology

## 2018-05-23 VITALS — BP 142/82 | HR 70 | Ht 61.0 in | Wt 112.4 lb

## 2018-05-23 DIAGNOSIS — E785 Hyperlipidemia, unspecified: Secondary | ICD-10-CM | POA: Diagnosis not present

## 2018-05-23 DIAGNOSIS — I10 Essential (primary) hypertension: Secondary | ICD-10-CM | POA: Diagnosis not present

## 2018-05-23 NOTE — Progress Notes (Signed)
Cardiology Office Note:    Date:  05/23/2018   ID:  Kathryn Drake, DOB [birth year], MRN [id number]  PCP:  Karle Starch, MD  Cardiologist:  Shirlee More, MD    Referring MD: Karle Starch    ASSESSMENT:    1. Essential hypertension   2. Hyperlipidemia, unspecified hyperlipidemia type    PLAN:    In order of problems listed above:  1. Stable home blood pressure target continue low-dose ARB lifestyle modification exercise 2. Compliant with over-the-counter red yeast rice fish oil she has arrangements for outpatient labs to be done and I will add a LP(a) advanced lipid profile lipid profile to her orders.   Next appointment: 24yr   Medication Adjustments/Labs and Tests Ordered: Current medicines are reviewed at length with the patient today.  Concerns regarding medicines are outlined above.  No orders of the defined types were placed in this encounter.  No orders of the defined types were placed in this encounter.   Chief Complaint  Patient presents with  . Hypertension  . Hyperlipidemia    History of Present Illness:    Kathryn Drake is a 67 y.o. female with a hx of hypertension , hyperlipidemia, chest pain with an abnormal CT of the chest 2013 mild enlargement asceending aorta 38 mm subsequent normal on follow-up CT in 2017. She was  last seen 02/01/2018. Compliance with diet, lifestyle and medications: yes  She has had a good year no chest pain dyspnea palpitation or syncope but is followed up with her PCP for cough.  Came red yeast rice fish oil discussed cardiac CT calcium score for risk assessment. Past Medical History:  Diagnosis Date  . Chest pain 12/30/2015  . Chronic cough 12/30/2015  . 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 Enlarged thoracic aorta ([place]) 08/30/2016   Overview:  33 mm ascending aorta diameter 12/18/15  . Essential hypertension 08/30/2016  . Fatigue  12/30/2015  . Pterygium, peripheral stationary, bilateral 04/25/2017    Past Surgical History:  Procedure Laterality Date  . ABDOMINAL HYSTERECTOMY    . CARPAL TUNNEL RELEASE    . SHOULDER SURGERY    . TONSILLECTOMY      Current Medications: Current Meds  Medication Sig  . Ascorbic Acid (VITAMIN C PO) Take by mouth.  . Biotin 5 MG CAPS Take 5 mg daily by mouth.  . co-enzyme Q-10 50 MG capsule Take 50 mg daily by mouth.  . diltiazem (CARDIZEM CD) 180 MG 24 hr capsule Take 1 capsule (180 mg total) by mouth daily.  . diphenhydramine-acetaminophen (TYLENOL PM) 25-500 MG TABS tablet Take 1 tablet daily as needed by mouth.  . estradiol (ESTRACE) 1 MG tablet Take 1 mg daily by mouth.  . losartan (COZAAR) 25 MG tablet Take 25 mg by mouth daily.  Marland Kitchen MAGNESIUM PO Take 30 mg daily by mouth.  . Multiple Vitamins-Minerals (MULTIVITAMIN ADULT PO) Take by mouth.  . Omega-3 Fatty Acids (FISH OIL) 1000 MG CAPS Take 1 capsule by mouth daily.  . pantoprazole (PROTONIX) 40 MG tablet Take 40 mg daily by mouth.  . progesterone (PROMETRIUM) 100 MG capsule Take 100 mg daily by mouth.  . Quercetin 250 MG TABS Take 250 mg daily by mouth.  . thyroid (ARMOUR THYROID) 60 MG tablet Take 1 tablet daily by mouth.  . valACYclovir (VALTREX) 1000 MG tablet Take 1 g daily as needed by mouth.     Allergies:  Gluten meal   Social History   Socioeconomic History  . Marital status: Married    Spouse name: Not on file  . Number of children: Not on file  . Years of education: Not on file  . Highest education level: Not on file  Occupational History  . Not on file  Social Needs  . Financial resource strain: Not on file  . Food insecurity:    Worry: Not on file    Inability: Not on file  . Transportation needs:    Medical: Not on file    Non-medical: Not on file  Tobacco Use  . Smoking status: Former Research scientist (life sciences)  . Smokeless tobacco: Never Used  Substance and Sexual Activity  . Alcohol use: Yes  . Drug use:  No  . Sexual activity: Not on file  Lifestyle  . Physical activity:    Days per week: Not on file    Minutes per session: Not on file  . Stress: Not on file  Relationships  . Social connections:    Talks on phone: Not on file    Gets together: Not on file    Attends religious service: Not on file    Active member of club or organization: Not on file    Attends meetings of clubs or organizations: Not on file    Relationship status: Not on file  Other Topics Concern  . Not on file  Social History Narrative  . Not on file     Family History: The patient's family history includes Diabetes in her father; Heart attack in her father and paternal grandfather. ROS:   Please see the history of present illness.    All other systems reviewed and are negative.  EKGs/Labs/Other Studies Reviewed:    The following studies were reviewed today:  EKG:  EKG ordered today.  The ekg ordered today demonstrates SRTh incomplete RBBB  Recent Labs: 06/15/2017: BUN 15; Creatinine, Ser 0.78; Potassium 3.9; Sodium 141  Recent Lipid Panel No results found for: CHOL, TRIG, HDL, CHOLHDL, VLDL, LDLCALC, LDLDIRECT  Physical Exam:    VS:  BP (!) 142/82 (BP Location: Right Arm, Patient Position: Sitting, Cuff Size: Small)   Pulse 70   Ht 5\' 1"  (1.549 m)   Wt 112 lb 6.4 oz (51 kg)   SpO2 98%   BMI 21.24 kg/m     Wt Readings from Last 3 Encounters:  05/23/18 112 lb 6.4 oz (51 kg)  02/01/18 103 lb 9.6 oz (47 kg)  05/31/17 110 lb (49.9 kg)     GEN:  Well nourished, well developed in no acute distress HEENT: Normal NECK: No JVD; No carotid bruits LYMPHATICS: No lymphadenopathy CARDIAC: RRR, no murmurs, rubs, gallops RESPIRATORY:  Clear to auscultation without rales, wheezing or rhonchi  ABDOMEN: Soft, non-tender, non-distended MUSCULOSKELETAL:  No edema; No deformity  SKIN: Warm and dry NEUROLOGIC:  Alert and oriented x 3 PSYCHIATRIC:  Normal affect    Signed, Shirlee More, MD  05/23/2018  3:49 PM    [facility]

## 2018-05-23 NOTE — Patient Instructions (Signed)
Medication Instructions:  Your physician recommends that you continue on your current medications as directed. Please refer to the Current Medication list given to you today.  If you need a refill on your cardiac medications before your next appointment, please call your pharmacy.   Lab work: Your physician recommends that you return for lab work within the next week: lipoprotein A (LPA), lipid profile, NMR lipoprofile at [facility] in [place]. Please fast beforehand. No appointment needed.   If you have labs (blood work) drawn today and your tests are completely normal, you will receive your results only by: Marland Kitchen MyChart Message (if you have MyChart) OR . A paper copy in the mail If you have any lab test that is abnormal or we need to change your treatment, we will call you to review the results.  Testing/Procedures: None  Follow-Up: At [facility], you and your health needs are our priority.  As part of our continuing mission to provide you with exceptional heart care, we have created designated Provider Care Teams.  These Care Teams include your primary Cardiologist (physician) and Advanced Practice Providers (APPs -  Physician Assistants and Nurse Practitioners) who all work together to provide you with the care you need, when you need it. You will need a follow up appointment in 1 years.  Please call our office 2 months in advance to schedule this appointment.

## 2018-05-28 DIAGNOSIS — R102 Pelvic and perineal pain: Secondary | ICD-10-CM | POA: Diagnosis not present

## 2018-06-12 DIAGNOSIS — E785 Hyperlipidemia, unspecified: Secondary | ICD-10-CM | POA: Diagnosis not present

## 2018-06-12 DIAGNOSIS — R5381 Other malaise: Secondary | ICD-10-CM | POA: Diagnosis not present

## 2018-06-13 LAB — LIPID PANEL
Chol/HDL Ratio: 3.4 ratio (ref 0.0–4.4)
Cholesterol, Total: 275 mg/dL — ABNORMAL HIGH (ref 100–199)
HDL: 82 mg/dL (ref >39)
LDL CALC: 169 mg/dL — AB (ref 0–99)
Triglycerides: 119 mg/dL (ref 0–149)
VLDL CHOLESTEROL CAL: 24 mg/dL (ref 5–40)

## 2018-06-13 LAB — NMR, LIPOPROFILE
CHOLESTEROL, TOTAL: 284 mg/dL — AB (ref 100–199)
HDL Particle Number: 42.6 umol/L (ref >=30.5)
HDL-C: 83 mg/dL (ref >39)
LDL PARTICLE NUMBER: 1711 nmol/L — AB (ref <1000)
LDL Size: 21.4 nm (ref >20.5)
LDL-C: 179 mg/dL — AB (ref 0–99)
Small LDL Particle Number: 477 nmol/L (ref <=527)
TRIGLYCERIDES: 111 mg/dL (ref 0–149)

## 2018-06-13 LAB — LIPOPROTEIN A (LPA): Lipoprotein (a): <8.4 nmol/L (ref <75.0)

## 2018-06-17 ENCOUNTER — Telehealth: Payer: Self-pay | Admitting: *Deleted

## 2018-06-17 NOTE — Telephone Encounter (Signed)
Left message to return call to discuss lab results on patient's home phone per DPR. No answer on cell phone and unable to leave message.

## 2018-06-19 DIAGNOSIS — I10 Essential (primary) hypertension: Secondary | ICD-10-CM | POA: Diagnosis not present

## 2018-06-19 DIAGNOSIS — R300 Dysuria: Secondary | ICD-10-CM | POA: Diagnosis not present

## 2018-06-19 DIAGNOSIS — E039 Hypothyroidism, unspecified: Secondary | ICD-10-CM | POA: Diagnosis not present

## 2018-06-19 DIAGNOSIS — Z0000 Encounter for general adult medical examination without abnormal findings: Secondary | ICD-10-CM | POA: Diagnosis not present

## 2018-06-19 DIAGNOSIS — K219 Gastro-esophageal reflux disease without esophagitis: Secondary | ICD-10-CM | POA: Diagnosis not present

## 2018-06-19 DIAGNOSIS — E785 Hyperlipidemia, unspecified: Secondary | ICD-10-CM | POA: Diagnosis not present

## 2018-07-26 DIAGNOSIS — R300 Dysuria: Secondary | ICD-10-CM | POA: Diagnosis not present

## 2018-07-26 DIAGNOSIS — N393 Stress incontinence (female) (male): Secondary | ICD-10-CM | POA: Diagnosis not present

## 2018-07-29 DIAGNOSIS — I10 Essential (primary) hypertension: Secondary | ICD-10-CM | POA: Diagnosis not present

## 2018-07-29 DIAGNOSIS — R5381 Other malaise: Secondary | ICD-10-CM | POA: Diagnosis not present

## 2018-07-29 DIAGNOSIS — E782 Mixed hyperlipidemia: Secondary | ICD-10-CM | POA: Diagnosis not present

## 2018-07-29 DIAGNOSIS — Z90710 Acquired absence of both cervix and uterus: Secondary | ICD-10-CM | POA: Diagnosis not present

## 2018-07-29 DIAGNOSIS — Z01419 Encounter for gynecological examination (general) (routine) without abnormal findings: Secondary | ICD-10-CM | POA: Diagnosis not present

## 2018-07-29 DIAGNOSIS — N959 Unspecified menopausal and perimenopausal disorder: Secondary | ICD-10-CM | POA: Diagnosis not present

## 2018-11-06 DIAGNOSIS — C44629 Squamous cell carcinoma of skin of left upper limb, including shoulder: Secondary | ICD-10-CM | POA: Diagnosis not present

## 2018-11-06 DIAGNOSIS — C44329 Squamous cell carcinoma of skin of other parts of face: Secondary | ICD-10-CM | POA: Diagnosis not present

## 2018-11-06 DIAGNOSIS — D485 Neoplasm of uncertain behavior of skin: Secondary | ICD-10-CM | POA: Diagnosis not present

## 2018-11-06 DIAGNOSIS — L820 Inflamed seborrheic keratosis: Secondary | ICD-10-CM | POA: Diagnosis not present

## 2018-11-06 DIAGNOSIS — L570 Actinic keratosis: Secondary | ICD-10-CM | POA: Diagnosis not present

## 2018-11-06 DIAGNOSIS — L821 Other seborrheic keratosis: Secondary | ICD-10-CM | POA: Diagnosis not present

## 2018-11-06 DIAGNOSIS — Z85828 Personal history of other malignant neoplasm of skin: Secondary | ICD-10-CM | POA: Diagnosis not present

## 2018-12-10 DIAGNOSIS — R5381 Other malaise: Secondary | ICD-10-CM | POA: Diagnosis not present

## 2018-12-10 DIAGNOSIS — E039 Hypothyroidism, unspecified: Secondary | ICD-10-CM | POA: Diagnosis not present

## 2018-12-10 DIAGNOSIS — I10 Essential (primary) hypertension: Secondary | ICD-10-CM | POA: Diagnosis not present

## 2018-12-10 DIAGNOSIS — N951 Menopausal and female climacteric states: Secondary | ICD-10-CM | POA: Diagnosis not present

## 2018-12-10 DIAGNOSIS — E782 Mixed hyperlipidemia: Secondary | ICD-10-CM | POA: Diagnosis not present

## 2018-12-17 DIAGNOSIS — H16403 Unspecified corneal neovascularization, bilateral: Secondary | ICD-10-CM | POA: Diagnosis not present

## 2018-12-17 DIAGNOSIS — H1840 Unspecified corneal degeneration: Secondary | ICD-10-CM | POA: Diagnosis not present

## 2018-12-17 DIAGNOSIS — H11043 Peripheral pterygium, stationary, bilateral: Secondary | ICD-10-CM | POA: Diagnosis not present

## 2018-12-17 DIAGNOSIS — H25813 Combined forms of age-related cataract, bilateral: Secondary | ICD-10-CM | POA: Diagnosis not present

## 2018-12-18 DIAGNOSIS — C44329 Squamous cell carcinoma of skin of other parts of face: Secondary | ICD-10-CM | POA: Diagnosis not present

## 2018-12-18 DIAGNOSIS — Z85828 Personal history of other malignant neoplasm of skin: Secondary | ICD-10-CM | POA: Diagnosis not present

## 2018-12-31 ENCOUNTER — Other Ambulatory Visit: Payer: Self-pay | Admitting: Family Medicine

## 2018-12-31 DIAGNOSIS — R5381 Other malaise: Secondary | ICD-10-CM

## 2019-01-06 ENCOUNTER — Other Ambulatory Visit: Payer: Self-pay | Admitting: Family Medicine

## 2019-01-06 DIAGNOSIS — M8580 Other specified disorders of bone density and structure, unspecified site: Secondary | ICD-10-CM

## 2019-01-07 ENCOUNTER — Ambulatory Visit
Admission: RE | Admit: 2019-01-07 | Discharge: 2019-01-07 | Disposition: A | Discharge status: Home / Self Care | Payer: PPO | Source: Ambulatory Visit | Attending: Family Medicine | Admitting: Family Medicine

## 2019-01-07 ENCOUNTER — Other Ambulatory Visit: Payer: Self-pay

## 2019-01-07 DIAGNOSIS — M8580 Other specified disorders of bone density and structure, unspecified site: Secondary | ICD-10-CM

## 2019-01-07 DIAGNOSIS — M8589 Other specified disorders of bone density and structure, multiple sites: Secondary | ICD-10-CM | POA: Diagnosis not present

## 2019-02-19 DIAGNOSIS — R195 Other fecal abnormalities: Secondary | ICD-10-CM | POA: Diagnosis not present

## 2019-02-19 DIAGNOSIS — Z6820 Body mass index (BMI) 20.0-20.9, adult: Secondary | ICD-10-CM | POA: Diagnosis not present

## 2019-02-19 DIAGNOSIS — K219 Gastro-esophageal reflux disease without esophagitis: Secondary | ICD-10-CM | POA: Diagnosis not present

## 2019-02-19 DIAGNOSIS — M25511 Pain in right shoulder: Secondary | ICD-10-CM | POA: Diagnosis not present

## 2019-04-16 DIAGNOSIS — R5381 Other malaise: Secondary | ICD-10-CM | POA: Diagnosis not present

## 2019-04-16 DIAGNOSIS — N951 Menopausal and female climacteric states: Secondary | ICD-10-CM | POA: Diagnosis not present

## 2019-04-16 DIAGNOSIS — M899 Disorder of bone, unspecified: Secondary | ICD-10-CM | POA: Diagnosis not present

## 2019-04-16 DIAGNOSIS — I10 Essential (primary) hypertension: Secondary | ICD-10-CM | POA: Diagnosis not present

## 2019-04-16 DIAGNOSIS — E782 Mixed hyperlipidemia: Secondary | ICD-10-CM | POA: Diagnosis not present

## 2019-04-16 DIAGNOSIS — E049 Nontoxic goiter, unspecified: Secondary | ICD-10-CM | POA: Diagnosis not present

## 2019-06-05 DIAGNOSIS — M810 Age-related osteoporosis without current pathological fracture: Secondary | ICD-10-CM | POA: Diagnosis not present

## 2019-06-05 DIAGNOSIS — R110 Nausea: Secondary | ICD-10-CM | POA: Diagnosis not present

## 2019-06-05 DIAGNOSIS — Z2821 Immunization not carried out because of patient refusal: Secondary | ICD-10-CM | POA: Diagnosis not present

## 2019-06-05 DIAGNOSIS — Z9181 History of falling: Secondary | ICD-10-CM | POA: Diagnosis not present

## 2019-06-05 DIAGNOSIS — R030 Elevated blood-pressure reading, without diagnosis of hypertension: Secondary | ICD-10-CM | POA: Diagnosis not present

## 2019-06-05 DIAGNOSIS — Z1331 Encounter for screening for depression: Secondary | ICD-10-CM | POA: Diagnosis not present

## 2019-06-05 DIAGNOSIS — K589 Irritable bowel syndrome without diarrhea: Secondary | ICD-10-CM | POA: Diagnosis not present

## 2019-06-09 ENCOUNTER — Other Ambulatory Visit: Payer: Self-pay

## 2019-06-19 DIAGNOSIS — R110 Nausea: Secondary | ICD-10-CM | POA: Diagnosis not present

## 2019-06-19 DIAGNOSIS — I10 Essential (primary) hypertension: Secondary | ICD-10-CM | POA: Diagnosis not present

## 2019-06-19 DIAGNOSIS — R5381 Other malaise: Secondary | ICD-10-CM | POA: Diagnosis not present

## 2019-06-19 DIAGNOSIS — E782 Mixed hyperlipidemia: Secondary | ICD-10-CM | POA: Diagnosis not present

## 2019-06-23 DIAGNOSIS — I10 Essential (primary) hypertension: Secondary | ICD-10-CM | POA: Diagnosis not present

## 2019-06-23 DIAGNOSIS — Z6820 Body mass index (BMI) 20.0-20.9, adult: Secondary | ICD-10-CM | POA: Diagnosis not present

## 2019-06-23 DIAGNOSIS — E039 Hypothyroidism, unspecified: Secondary | ICD-10-CM | POA: Diagnosis not present

## 2019-06-23 DIAGNOSIS — M810 Age-related osteoporosis without current pathological fracture: Secondary | ICD-10-CM | POA: Diagnosis not present

## 2019-06-23 DIAGNOSIS — F419 Anxiety disorder, unspecified: Secondary | ICD-10-CM | POA: Diagnosis not present

## 2019-07-22 DIAGNOSIS — K219 Gastro-esophageal reflux disease without esophagitis: Secondary | ICD-10-CM | POA: Diagnosis not present

## 2019-07-22 DIAGNOSIS — G4733 Obstructive sleep apnea (adult) (pediatric): Secondary | ICD-10-CM | POA: Diagnosis not present

## 2019-08-05 DIAGNOSIS — N951 Menopausal and female climacteric states: Secondary | ICD-10-CM | POA: Diagnosis not present

## 2019-08-05 DIAGNOSIS — E049 Nontoxic goiter, unspecified: Secondary | ICD-10-CM | POA: Diagnosis not present

## 2019-08-05 DIAGNOSIS — R5381 Other malaise: Secondary | ICD-10-CM | POA: Diagnosis not present

## 2019-08-29 DIAGNOSIS — Z1231 Encounter for screening mammogram for malignant neoplasm of breast: Secondary | ICD-10-CM | POA: Diagnosis not present

## 2019-08-29 DIAGNOSIS — R928 Other abnormal and inconclusive findings on diagnostic imaging of breast: Secondary | ICD-10-CM | POA: Diagnosis not present

## 2019-09-02 DIAGNOSIS — G4733 Obstructive sleep apnea (adult) (pediatric): Secondary | ICD-10-CM | POA: Diagnosis not present

## 2019-09-09 DIAGNOSIS — K219 Gastro-esophageal reflux disease without esophagitis: Secondary | ICD-10-CM | POA: Diagnosis not present

## 2019-09-24 DIAGNOSIS — N6489 Other specified disorders of breast: Secondary | ICD-10-CM | POA: Diagnosis not present

## 2019-09-24 DIAGNOSIS — R920 Mammographic microcalcification found on diagnostic imaging of breast: Secondary | ICD-10-CM | POA: Diagnosis not present

## 2019-09-24 DIAGNOSIS — R921 Mammographic calcification found on diagnostic imaging of breast: Secondary | ICD-10-CM | POA: Diagnosis not present

## 2019-10-31 DIAGNOSIS — N959 Unspecified menopausal and perimenopausal disorder: Secondary | ICD-10-CM | POA: Diagnosis not present

## 2019-10-31 DIAGNOSIS — I10 Essential (primary) hypertension: Secondary | ICD-10-CM | POA: Diagnosis not present

## 2019-10-31 DIAGNOSIS — E042 Nontoxic multinodular goiter: Secondary | ICD-10-CM | POA: Diagnosis not present

## 2019-10-31 DIAGNOSIS — R5381 Other malaise: Secondary | ICD-10-CM | POA: Diagnosis not present

## 2019-10-31 DIAGNOSIS — R7309 Other abnormal glucose: Secondary | ICD-10-CM | POA: Diagnosis not present

## 2019-11-10 DIAGNOSIS — D225 Melanocytic nevi of trunk: Secondary | ICD-10-CM | POA: Diagnosis not present

## 2019-11-10 DIAGNOSIS — D485 Neoplasm of uncertain behavior of skin: Secondary | ICD-10-CM | POA: Diagnosis not present

## 2019-11-10 DIAGNOSIS — L570 Actinic keratosis: Secondary | ICD-10-CM | POA: Diagnosis not present

## 2019-11-10 DIAGNOSIS — Z85828 Personal history of other malignant neoplasm of skin: Secondary | ICD-10-CM | POA: Diagnosis not present

## 2019-11-10 DIAGNOSIS — L820 Inflamed seborrheic keratosis: Secondary | ICD-10-CM | POA: Diagnosis not present

## 2019-11-10 DIAGNOSIS — L821 Other seborrheic keratosis: Secondary | ICD-10-CM | POA: Diagnosis not present

## 2019-11-10 DIAGNOSIS — C44519 Basal cell carcinoma of skin of other part of trunk: Secondary | ICD-10-CM | POA: Diagnosis not present

## 2019-11-10 DIAGNOSIS — D0472 Carcinoma in situ of skin of left lower limb, including hip: Secondary | ICD-10-CM | POA: Diagnosis not present

## 2020-01-05 DIAGNOSIS — C44729 Squamous cell carcinoma of skin of left lower limb, including hip: Secondary | ICD-10-CM | POA: Diagnosis not present

## 2020-01-05 DIAGNOSIS — Z85828 Personal history of other malignant neoplasm of skin: Secondary | ICD-10-CM | POA: Diagnosis not present

## 2020-01-05 DIAGNOSIS — L820 Inflamed seborrheic keratosis: Secondary | ICD-10-CM | POA: Diagnosis not present

## 2020-01-05 DIAGNOSIS — D485 Neoplasm of uncertain behavior of skin: Secondary | ICD-10-CM | POA: Diagnosis not present

## 2020-02-10 ENCOUNTER — Other Ambulatory Visit: Payer: Self-pay

## 2020-05-11 DIAGNOSIS — R928 Other abnormal and inconclusive findings on diagnostic imaging of breast: Secondary | ICD-10-CM | POA: Diagnosis not present

## 2020-05-11 DIAGNOSIS — I10 Essential (primary) hypertension: Secondary | ICD-10-CM | POA: Diagnosis not present

## 2020-05-11 DIAGNOSIS — Z6821 Body mass index (BMI) 21.0-21.9, adult: Secondary | ICD-10-CM | POA: Diagnosis not present

## 2020-05-11 DIAGNOSIS — M810 Age-related osteoporosis without current pathological fracture: Secondary | ICD-10-CM | POA: Diagnosis not present

## 2020-05-12 DIAGNOSIS — L281 Prurigo nodularis: Secondary | ICD-10-CM | POA: Diagnosis not present

## 2020-05-12 DIAGNOSIS — D485 Neoplasm of uncertain behavior of skin: Secondary | ICD-10-CM | POA: Diagnosis not present

## 2020-05-12 DIAGNOSIS — L821 Other seborrheic keratosis: Secondary | ICD-10-CM | POA: Diagnosis not present

## 2020-05-12 DIAGNOSIS — C44722 Squamous cell carcinoma of skin of right lower limb, including hip: Secondary | ICD-10-CM | POA: Diagnosis not present

## 2020-05-12 DIAGNOSIS — Z85828 Personal history of other malignant neoplasm of skin: Secondary | ICD-10-CM | POA: Diagnosis not present

## 2020-05-12 DIAGNOSIS — L570 Actinic keratosis: Secondary | ICD-10-CM | POA: Diagnosis not present

## 2020-05-26 DIAGNOSIS — I10 Essential (primary) hypertension: Secondary | ICD-10-CM | POA: Diagnosis not present

## 2020-05-26 DIAGNOSIS — N959 Unspecified menopausal and perimenopausal disorder: Secondary | ICD-10-CM | POA: Diagnosis not present

## 2020-05-26 DIAGNOSIS — R7309 Other abnormal glucose: Secondary | ICD-10-CM | POA: Diagnosis not present

## 2020-05-26 DIAGNOSIS — M899 Disorder of bone, unspecified: Secondary | ICD-10-CM | POA: Diagnosis not present

## 2020-05-26 DIAGNOSIS — E782 Mixed hyperlipidemia: Secondary | ICD-10-CM | POA: Diagnosis not present

## 2020-05-26 DIAGNOSIS — R5381 Other malaise: Secondary | ICD-10-CM | POA: Diagnosis not present

## 2020-05-26 DIAGNOSIS — E049 Nontoxic goiter, unspecified: Secondary | ICD-10-CM | POA: Diagnosis not present

## 2020-06-17 DIAGNOSIS — R922 Inconclusive mammogram: Secondary | ICD-10-CM | POA: Diagnosis not present

## 2020-06-17 DIAGNOSIS — R928 Other abnormal and inconclusive findings on diagnostic imaging of breast: Secondary | ICD-10-CM | POA: Diagnosis not present

## 2020-08-03 NOTE — Progress Notes (Deleted)
Cardiology Office Note:    Date:  08/03/2020   ID:  Kathryn Drake, DOB 01-12-1951, MRN [id number]  PCP:  Karle Starch, MD  Cardiologist:  Shirlee More, MD    Referring MD: Karle Starch    ASSESSMENT:    No diagnosis found. PLAN:    In order of problems listed above:  1. ***   Next appointment: ***   Medication Adjustments/Labs and Tests Ordered: Current medicines are reviewed at length with the patient today.  Concerns regarding medicines are outlined above.  No orders of the defined types were placed in this encounter.  No orders of the defined types were placed in this encounter.   No chief complaint on file.   History of Present Illness:    Kathryn Drake is a 70 y.o. female with a hx of hypertension hyperlipidemia and mild enlargement ascending aorta initially and chest CT in 2013 subsequently read as normal and follow-up in 2019.  She was last seen 05/23/2018.  She has severe dyslipidemia with an LDL cholesterol 179. Compliance with diet, lifestyle and medications: *** Past Medical History:  Diagnosis Date  . Chest pain 12/30/2015  . Chronic cough 12/30/2015  . 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 Enlarged thoracic aorta ([place]) 08/30/2016   Overview:  33 mm ascending aorta diameter 12/18/15  . Essential hypertension 08/30/2016  . Fatigue 12/30/2015  . Hyperlipidemia 02/01/2018  . Pterygium, peripheral stationary, bilateral 04/25/2017    Past Surgical History:  Procedure Laterality Date  . ABDOMINAL HYSTERECTOMY    . CARPAL TUNNEL RELEASE    . SHOULDER SURGERY    . TONSILLECTOMY      Current Medications: No outpatient medications have been marked as taking for the 08/04/20 encounter (Appointment) with Richardo Priest, MD.     Allergies:   Gluten meal   Social History   Socioeconomic History  . Marital status: Married    Spouse name: Not on file  .  Number of children: Not on file  . Years of education: Not on file  . Highest education level: Not on file  Occupational History  . Not on file  Tobacco Use  . Smoking status: Former Research scientist (life sciences)  . Smokeless tobacco: Never Used  Vaping Use  . Vaping Use: Never used  Substance and Sexual Activity  . Alcohol use: Yes  . Drug use: No  . Sexual activity: Not on file  Other Topics Concern  . Not on file  Social History Narrative  . Not on file   Social Determinants of Health   Financial Resource Strain: Not on file  Food Insecurity: Not on file  Transportation Needs: Not on file  Physical Activity: Not on file  Stress: Not on file  Social Connections: Not on file     Family History: The patient's ***family history includes Diabetes in her father; Heart attack in her father and paternal grandfather. ROS:   Please see the history of present illness.    All other systems reviewed and are negative.  EKGs/Labs/Other Studies Reviewed:    The following studies were reviewed today:  EKG:  EKG ordered today and personally reviewed.  The ekg ordered today demonstrates ***  Recent Labs: No results found for requested labs within last 8760 hours.  Recent Lipid Panel    Component Value Date/Time   CHOL 275 (H) 06/12/2018 0826   TRIG 119 06/12/2018 0826   HDL 82 06/12/2018 0826  CHOLHDL 3.4 06/12/2018 0826   LDLCALC 169 (H) 06/12/2018 5188    Physical Exam:    VS:  There were no vitals taken for this visit.    Wt Readings from Last 3 Encounters:  05/23/18 112 lb 6.4 oz (51 kg)  02/01/18 103 lb 9.6 oz (47 kg)  05/31/17 110 lb (49.9 kg)     GEN: *** Well nourished, well developed in no acute distress HEENT: Normal NECK: No JVD; No carotid bruits LYMPHATICS: No lymphadenopathy CARDIAC: ***RRR, no murmurs, rubs, gallops RESPIRATORY:  Clear to auscultation without rales, wheezing or rhonchi  ABDOMEN: Soft, non-tender, non-distended MUSCULOSKELETAL:  No edema; No deformity   SKIN: Warm and dry NEUROLOGIC:  Alert and oriented x 3 PSYCHIATRIC:  Normal affect    Signed, Shirlee More, MD  08/03/2020 8:08 AM    [place]

## 2020-08-04 ENCOUNTER — Ambulatory Visit: Payer: PPO | Admitting: Cardiology

## 2020-08-29 NOTE — Progress Notes (Signed)
Cardiology Office Note:    Date:  08/30/2020   ID:  STEVEN Drake, DOB 01-Jan-1951, MRN [id number]  PCP:  Karle Starch, MD  Cardiologist:  Shirlee More, MD    Referring MD: Karle Starch    ASSESSMENT:    1. Enlarged thoracic aorta ([place])   2. Essential hypertension    PLAN:    In order of problems listed above:  1. Not uncommon in her age group approximately 10% of people with cardiac CTA have mild enlargement of the ascending aorta or recheck echocardiogram. 2. Stable continue current treatment including calcium channel blocker ARB and thiazide diuretic   Next appointment: 1 year   Medication Adjustments/Labs and Tests Ordered: Current medicines are reviewed at length with the patient today.  Concerns regarding medicines are outlined above.  Orders Placed This Encounter  Procedures  . EKG 12-Lead  . ECHOCARDIOGRAM COMPLETE   No orders of the defined types were placed in this encounter.   Chief Complaint  Patient presents with  . Follow-up    Last seen 2 years ago    History of Present Illness:    Kathryn Drake is a 70 y.o. female with a hx of hypertension and hyperlipidemia last seen by me over 2 years ago on 05/23/2018. Compliance with diet, lifestyle and medications: Yes  Kathryn Drake was advised by her naturopath to have a repeat echocardiogram for enlarged aorta. Home blood pressure runs in 120-130/70 range. She has had no chest pain edema shortness of breath or palpitation. She relates recent labs with her PCP I cannot see them in the electronic record and tells me her thyroid and kidney function are normal. Past Medical History:  Diagnosis Date  . Chest pain 12/30/2015  . Chronic cough 12/30/2015  . 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 Enlarged thoracic aorta ([place]) 08/30/2016   Overview:  33 mm ascending aorta diameter 12/18/15  . Essential hypertension 08/30/2016   . Fatigue 12/30/2015  . Hyperlipidemia 02/01/2018  . Pterygium, peripheral stationary, bilateral 04/25/2017    Past Surgical History:  Procedure Laterality Date  . ABDOMINAL HYSTERECTOMY    . CARPAL TUNNEL RELEASE    . SHOULDER SURGERY    . TONSILLECTOMY      Current Medications: Current Meds  Medication Sig  . ARMOUR THYROID 30 MG tablet Take 1 tablet by mouth. Mon, Wed and Friday  . co-enzyme Q-10 50 MG capsule Take 100 mg by mouth daily.  . Cyanocobalamin (VITAMIN B-12) 5000 MCG SUBL Place under the tongue daily.  Marland Kitchen diltiazem (CARDIZEM CD) 300 MG 24 hr capsule Take 1 capsule by mouth daily.  . diphenhydramine-acetaminophen (TYLENOL PM) 25-500 MG TABS tablet Take 1 tablet daily as needed by mouth.  . Estrad-Estriol-Testost-Progest (BI-EST PROGEST-TESTOSTERONE) CREA Place 0.75 mLs onto the skin 2 (two) times a week. Tuesday and Thursday  . GLUTATHIONE PO Take 250 mg by mouth daily.  . hydrochlorothiazide (HYDRODIURIL) 12.5 MG tablet Take 12.5 mg by mouth every morning.  Marland Kitchen losartan (COZAAR) 25 MG tablet Take 25 mg by mouth daily.  Marland Kitchen MAGNESIUM PO Take 50 mg by mouth daily.  . Multiple Vitamins-Minerals (MULTIVITAMIN ADULT PO) Take by mouth.  Kathryn Drake Leaf Extract 500 MG CAPS Take 500 mg by mouth daily.  . Omega-3 Fatty Acids (FISH OIL) 1000 MG CAPS Take 1 capsule by mouth daily.  . pantoprazole (PROTONIX) 40 MG tablet Take 40 mg daily by mouth.  . Probiotic  Product (PROBIOTIC PO) Take 1 tablet by mouth daily.  . progesterone (PROMETRIUM) 100 MG capsule Take 200 mg by mouth daily. Mon, Tues, Thurs, Sat and Sun  . QUERCETIN PO Take 400 mg by mouth daily.  . sertraline (ZOLOFT) 25 MG tablet Take 25 mg by mouth daily.  Marland Kitchen thyroid (ARMOUR) 60 MG tablet Take 1 tablet by mouth daily. Tuesday, Thurs, Saturday and Sunday  . UNABLE TO FIND 5,000 Units daily. Plant D-3  . valACYclovir (VALTREX) 1000 MG tablet Take 1 g daily as needed by mouth.  . vitamin C (ASCORBIC ACID) 250 MG tablet Take  250 mg by mouth daily.  . Zinc 30 MG TABS Take 1 tablet by mouth daily.     Allergies:   Gluten meal   Social History   Socioeconomic History  . Marital status: Married    Spouse name: Not on file  . Number of children: Not on file  . Years of education: Not on file  . Highest education level: Not on file  Occupational History  . Not on file  Tobacco Use  . Smoking status: Former Research scientist (life sciences)  . Smokeless tobacco: Never Used  Vaping Use  . Vaping Use: Never used  Substance and Sexual Activity  . Alcohol use: Yes  . Drug use: No  . Sexual activity: Not on file  Other Topics Concern  . Not on file  Social History Narrative  . Not on file   Social Determinants of Health   Financial Resource Strain: Not on file  Food Insecurity: Not on file  Transportation Needs: Not on file  Physical Activity: Not on file  Stress: Not on file  Social Connections: Not on file     Family History: The patient's family history includes Diabetes in her father; Heart attack in her father and paternal grandfather. ROS:   Please see the history of present illness.    All other systems reviewed and are negative.  EKGs/Labs/Other Studies Reviewed:    The following studies were reviewed today:  EKG:  EKG ordered today and personally reviewed.  The ekg ordered today demonstrates sinus rhythm incomplete right bundle branch block otherwise normal  Recent Labs: No results found for requested labs within last 8760 hours.  Recent Lipid Panel    Component Value Date/Time   CHOL 275 (H) 06/12/2018 0826   TRIG 119 06/12/2018 0826   HDL 82 06/12/2018 0826   CHOLHDL 3.4 06/12/2018 0826   LDLCALC 169 (H) 06/12/2018 0826    Physical Exam:    VS:  BP (!) 156/60   Pulse 79   Ht 5\' 1"  (1.549 m)   Wt 117 lb 3.2 oz (53.2 kg)   SpO2 96%   BMI 22.14 kg/m     Wt Readings from Last 3 Encounters:  08/30/20 117 lb 3.2 oz (53.2 kg)  05/23/18 112 lb 6.4 oz (51 kg)  02/01/18 103 lb 9.6 oz (47 kg)      GEN:  Well nourished, well developed in no acute distress HEENT: Normal NECK: No JVD; No carotid bruits LYMPHATICS: No lymphadenopathy CARDIAC: No murmur of aortic regurgitation RRR, no murmurs, rubs, gallops RESPIRATORY:  Clear to auscultation without rales, wheezing or rhonchi  ABDOMEN: Soft, non-tender, non-distended MUSCULOSKELETAL:  No edema; No deformity  SKIN: Warm and dry NEUROLOGIC:  Alert and oriented x 3 PSYCHIATRIC:  Normal affect    Signed, Shirlee More, MD  08/30/2020 4:24 PM    [facility]

## 2020-08-30 ENCOUNTER — Other Ambulatory Visit: Payer: Self-pay

## 2020-08-30 ENCOUNTER — Encounter: Payer: Self-pay | Admitting: Cardiology

## 2020-08-30 ENCOUNTER — Ambulatory Visit: Payer: PPO | Admitting: Cardiology

## 2020-08-30 VITALS — BP 130/80 | HR 79 | Ht 61.0 in | Wt 117.2 lb

## 2020-08-30 DIAGNOSIS — I7789 Other specified disorders of arteries and arterioles: Secondary | ICD-10-CM | POA: Diagnosis not present

## 2020-08-30 DIAGNOSIS — I10 Essential (primary) hypertension: Secondary | ICD-10-CM | POA: Diagnosis not present

## 2020-08-30 NOTE — Patient Instructions (Signed)
Medication Instructions:  Your physician recommends that you continue on your current medications as directed. Please refer to the Current Medication list given to you today.  *If you need a refill on your cardiac medications before your next appointment, please call your pharmacy*   Lab Work: None If you have labs (blood work) drawn today and your tests are completely normal, you will receive your results only by: MyChart Message (if you have MyChart) OR A paper copy in the mail If you have any lab test that is abnormal or we need to change your treatment, we will call you to review the results.   Testing/Procedures: Your physician has requested that you have an echocardiogram. Echocardiography is a painless test that uses sound waves to create images of your heart. It provides your doctor with information about the size and shape of your heart and how well your heart's chambers and valves are working. This procedure takes approximately one hour. There are no restrictions for this procedure.    Follow-Up: At [facility], you and your health needs are our priority.  As part of our continuing mission to provide you with exceptional heart care, we have created designated Provider Care Teams.  These Care Teams include your primary Cardiologist (physician) and Advanced Practice Providers (APPs -  Physician Assistants and Nurse Practitioners) who all work together to provide you with the care you need, when you need it.  We recommend signing up for the patient portal called "MyChart".  Sign up information is provided on this After Visit Summary.  MyChart is used to connect with patients for Virtual Visits (Telemedicine).  Patients are able to view lab/test results, encounter notes, upcoming appointments, etc.  Non-urgent messages can be sent to your provider as well.   To learn more about what you can do with MyChart, go to [email or web address].    Your next appointment:   1 year(s)  The  format for your next appointment:   In Person  Provider:   Brian Munley, MD   Other Instructions   

## 2020-09-09 DIAGNOSIS — L570 Actinic keratosis: Secondary | ICD-10-CM | POA: Diagnosis not present

## 2020-09-09 DIAGNOSIS — Z85828 Personal history of other malignant neoplasm of skin: Secondary | ICD-10-CM | POA: Diagnosis not present

## 2020-09-10 DIAGNOSIS — Z800 Family history of malignant neoplasm of digestive organs: Secondary | ICD-10-CM | POA: Diagnosis not present

## 2020-09-10 DIAGNOSIS — K219 Gastro-esophageal reflux disease without esophagitis: Secondary | ICD-10-CM | POA: Diagnosis not present

## 2020-09-30 ENCOUNTER — Telehealth: Payer: Self-pay

## 2020-09-30 ENCOUNTER — Other Ambulatory Visit: Payer: Self-pay

## 2020-09-30 ENCOUNTER — Ambulatory Visit (INDEPENDENT_AMBULATORY_CARE_PROVIDER_SITE_OTHER): Payer: PPO

## 2020-09-30 DIAGNOSIS — I77819 Aortic ectasia, unspecified site: Secondary | ICD-10-CM

## 2020-09-30 DIAGNOSIS — I7789 Other specified disorders of arteries and arterioles: Secondary | ICD-10-CM

## 2020-09-30 LAB — ECHOCARDIOGRAM COMPLETE
Area-P 1/2: 3.6 cm2
S' Lateral: 2.4 cm

## 2020-09-30 NOTE — Telephone Encounter (Signed)
-----   Message from Richardo Priest, MD sent at 09/30/2020  1:49 PM EDT ----- I think overall this is a good report  Aorta is very mildly enlarged not an aneurysm I do not think she will require repeat test in the next few years.

## 2020-09-30 NOTE — Telephone Encounter (Signed)
Patient returning call.

## 2020-09-30 NOTE — Telephone Encounter (Signed)
Left message on patients voicemail to please return our call.   

## 2020-09-30 NOTE — Telephone Encounter (Signed)
Spoke with patient regarding results and recommendation.  Patient verbalizes understanding and is agreeable to plan of care. Advised patient to call back with any issues or concerns.  

## 2020-09-30 NOTE — Progress Notes (Signed)
Complete echocardiogram performed.  Jimmy Malley Hauter RDCS, RVT  

## 2020-10-08 DIAGNOSIS — I10 Essential (primary) hypertension: Secondary | ICD-10-CM | POA: Diagnosis not present

## 2020-10-08 DIAGNOSIS — R5381 Other malaise: Secondary | ICD-10-CM | POA: Diagnosis not present

## 2020-10-08 DIAGNOSIS — E782 Mixed hyperlipidemia: Secondary | ICD-10-CM | POA: Diagnosis not present

## 2020-10-08 DIAGNOSIS — R7309 Other abnormal glucose: Secondary | ICD-10-CM | POA: Diagnosis not present

## 2020-10-13 DIAGNOSIS — K5730 Diverticulosis of large intestine without perforation or abscess without bleeding: Secondary | ICD-10-CM | POA: Diagnosis not present

## 2020-10-13 DIAGNOSIS — K219 Gastro-esophageal reflux disease without esophagitis: Secondary | ICD-10-CM | POA: Diagnosis not present

## 2020-10-13 DIAGNOSIS — K2100 Gastro-esophageal reflux disease with esophagitis, without bleeding: Secondary | ICD-10-CM | POA: Diagnosis not present

## 2020-10-13 DIAGNOSIS — D122 Benign neoplasm of ascending colon: Secondary | ICD-10-CM | POA: Diagnosis not present

## 2020-10-13 DIAGNOSIS — Z800 Family history of malignant neoplasm of digestive organs: Secondary | ICD-10-CM | POA: Diagnosis not present

## 2020-10-13 DIAGNOSIS — D123 Benign neoplasm of transverse colon: Secondary | ICD-10-CM | POA: Diagnosis not present

## 2020-10-13 DIAGNOSIS — K2281 Esophageal polyp: Secondary | ICD-10-CM | POA: Diagnosis not present

## 2020-10-13 DIAGNOSIS — K2289 Other specified disease of esophagus: Secondary | ICD-10-CM | POA: Diagnosis not present

## 2020-10-13 DIAGNOSIS — K317 Polyp of stomach and duodenum: Secondary | ICD-10-CM | POA: Diagnosis not present

## 2020-11-17 DIAGNOSIS — L821 Other seborrheic keratosis: Secondary | ICD-10-CM | POA: Diagnosis not present

## 2020-11-17 DIAGNOSIS — L814 Other melanin hyperpigmentation: Secondary | ICD-10-CM | POA: Diagnosis not present

## 2020-11-17 DIAGNOSIS — C44212 Basal cell carcinoma of skin of right ear and external auricular canal: Secondary | ICD-10-CM | POA: Diagnosis not present

## 2020-11-17 DIAGNOSIS — Z85828 Personal history of other malignant neoplasm of skin: Secondary | ICD-10-CM | POA: Diagnosis not present

## 2020-11-17 DIAGNOSIS — D485 Neoplasm of uncertain behavior of skin: Secondary | ICD-10-CM | POA: Diagnosis not present

## 2020-11-17 DIAGNOSIS — D225 Melanocytic nevi of trunk: Secondary | ICD-10-CM | POA: Diagnosis not present

## 2020-11-17 DIAGNOSIS — L570 Actinic keratosis: Secondary | ICD-10-CM | POA: Diagnosis not present

## 2020-12-14 DIAGNOSIS — H11003 Unspecified pterygium of eye, bilateral: Secondary | ICD-10-CM | POA: Diagnosis not present

## 2020-12-14 DIAGNOSIS — H25813 Combined forms of age-related cataract, bilateral: Secondary | ICD-10-CM | POA: Diagnosis not present

## 2020-12-14 DIAGNOSIS — H5213 Myopia, bilateral: Secondary | ICD-10-CM | POA: Diagnosis not present

## 2020-12-27 DIAGNOSIS — N3281 Overactive bladder: Secondary | ICD-10-CM | POA: Diagnosis not present

## 2020-12-27 DIAGNOSIS — N3941 Urge incontinence: Secondary | ICD-10-CM | POA: Diagnosis not present

## 2020-12-27 DIAGNOSIS — N393 Stress incontinence (female) (male): Secondary | ICD-10-CM | POA: Diagnosis not present

## 2020-12-27 DIAGNOSIS — R350 Frequency of micturition: Secondary | ICD-10-CM | POA: Diagnosis not present

## 2021-01-03 DIAGNOSIS — Z85828 Personal history of other malignant neoplasm of skin: Secondary | ICD-10-CM | POA: Diagnosis not present

## 2021-01-03 DIAGNOSIS — C44212 Basal cell carcinoma of skin of right ear and external auricular canal: Secondary | ICD-10-CM | POA: Diagnosis not present

## 2021-01-19 DIAGNOSIS — N390 Urinary tract infection, site not specified: Secondary | ICD-10-CM | POA: Diagnosis not present

## 2021-02-09 DIAGNOSIS — R300 Dysuria: Secondary | ICD-10-CM | POA: Diagnosis not present

## 2021-02-09 DIAGNOSIS — N3091 Cystitis, unspecified with hematuria: Secondary | ICD-10-CM | POA: Diagnosis not present

## 2021-03-09 DIAGNOSIS — R5381 Other malaise: Secondary | ICD-10-CM | POA: Diagnosis not present

## 2021-03-09 DIAGNOSIS — M810 Age-related osteoporosis without current pathological fracture: Secondary | ICD-10-CM | POA: Diagnosis not present

## 2021-05-06 DIAGNOSIS — I10 Essential (primary) hypertension: Secondary | ICD-10-CM | POA: Diagnosis not present

## 2021-05-06 DIAGNOSIS — I7789 Other specified disorders of arteries and arterioles: Secondary | ICD-10-CM | POA: Diagnosis not present

## 2021-05-06 DIAGNOSIS — I493 Ventricular premature depolarization: Secondary | ICD-10-CM | POA: Diagnosis not present

## 2021-05-06 DIAGNOSIS — E7849 Other hyperlipidemia: Secondary | ICD-10-CM | POA: Diagnosis not present

## 2021-05-06 DIAGNOSIS — R002 Palpitations: Secondary | ICD-10-CM | POA: Diagnosis not present

## 2021-05-24 DIAGNOSIS — D485 Neoplasm of uncertain behavior of skin: Secondary | ICD-10-CM | POA: Diagnosis not present

## 2021-05-24 DIAGNOSIS — L814 Other melanin hyperpigmentation: Secondary | ICD-10-CM | POA: Diagnosis not present

## 2021-05-24 DIAGNOSIS — C44622 Squamous cell carcinoma of skin of right upper limb, including shoulder: Secondary | ICD-10-CM | POA: Diagnosis not present

## 2021-05-24 DIAGNOSIS — C44529 Squamous cell carcinoma of skin of other part of trunk: Secondary | ICD-10-CM | POA: Diagnosis not present

## 2021-05-24 DIAGNOSIS — Z85828 Personal history of other malignant neoplasm of skin: Secondary | ICD-10-CM | POA: Diagnosis not present

## 2021-05-24 DIAGNOSIS — L570 Actinic keratosis: Secondary | ICD-10-CM | POA: Diagnosis not present

## 2021-06-08 DIAGNOSIS — I081 Rheumatic disorders of both mitral and tricuspid valves: Secondary | ICD-10-CM | POA: Diagnosis not present

## 2021-06-08 DIAGNOSIS — I77810 Thoracic aortic ectasia: Secondary | ICD-10-CM | POA: Diagnosis not present

## 2021-07-12 DIAGNOSIS — E042 Nontoxic multinodular goiter: Secondary | ICD-10-CM | POA: Diagnosis not present

## 2021-07-12 DIAGNOSIS — R609 Edema, unspecified: Secondary | ICD-10-CM | POA: Diagnosis not present

## 2021-07-12 DIAGNOSIS — R079 Chest pain, unspecified: Secondary | ICD-10-CM | POA: Diagnosis not present

## 2021-07-12 DIAGNOSIS — E782 Mixed hyperlipidemia: Secondary | ICD-10-CM | POA: Diagnosis not present

## 2021-07-12 DIAGNOSIS — R7309 Other abnormal glucose: Secondary | ICD-10-CM | POA: Diagnosis not present

## 2021-07-12 DIAGNOSIS — N959 Unspecified menopausal and perimenopausal disorder: Secondary | ICD-10-CM | POA: Diagnosis not present

## 2021-07-12 DIAGNOSIS — I10 Essential (primary) hypertension: Secondary | ICD-10-CM | POA: Diagnosis not present

## 2021-10-05 ENCOUNTER — Other Ambulatory Visit: Payer: Self-pay | Admitting: Family Medicine

## 2021-10-05 DIAGNOSIS — M810 Age-related osteoporosis without current pathological fracture: Secondary | ICD-10-CM

## 2021-10-26 DIAGNOSIS — M810 Age-related osteoporosis without current pathological fracture: Secondary | ICD-10-CM | POA: Diagnosis not present

## 2021-11-21 DIAGNOSIS — L821 Other seborrheic keratosis: Secondary | ICD-10-CM | POA: Diagnosis not present

## 2021-11-21 DIAGNOSIS — Z85828 Personal history of other malignant neoplasm of skin: Secondary | ICD-10-CM | POA: Diagnosis not present

## 2021-11-21 DIAGNOSIS — D485 Neoplasm of uncertain behavior of skin: Secondary | ICD-10-CM | POA: Diagnosis not present

## 2021-11-21 DIAGNOSIS — C44529 Squamous cell carcinoma of skin of other part of trunk: Secondary | ICD-10-CM | POA: Diagnosis not present

## 2021-11-21 DIAGNOSIS — L570 Actinic keratosis: Secondary | ICD-10-CM | POA: Diagnosis not present

## 2021-11-22 DIAGNOSIS — F419 Anxiety disorder, unspecified: Secondary | ICD-10-CM | POA: Diagnosis not present

## 2021-11-22 DIAGNOSIS — E7800 Pure hypercholesterolemia, unspecified: Secondary | ICD-10-CM | POA: Diagnosis not present

## 2021-11-22 DIAGNOSIS — Z1331 Encounter for screening for depression: Secondary | ICD-10-CM | POA: Diagnosis not present

## 2021-11-22 DIAGNOSIS — E039 Hypothyroidism, unspecified: Secondary | ICD-10-CM | POA: Diagnosis not present

## 2021-11-22 DIAGNOSIS — Z0000 Encounter for general adult medical examination without abnormal findings: Secondary | ICD-10-CM | POA: Diagnosis not present

## 2021-11-22 DIAGNOSIS — I10 Essential (primary) hypertension: Secondary | ICD-10-CM | POA: Diagnosis not present

## 2021-11-22 DIAGNOSIS — Z6820 Body mass index (BMI) 20.0-20.9, adult: Secondary | ICD-10-CM | POA: Diagnosis not present

## 2021-11-22 DIAGNOSIS — M810 Age-related osteoporosis without current pathological fracture: Secondary | ICD-10-CM | POA: Diagnosis not present

## 2021-11-22 DIAGNOSIS — K219 Gastro-esophageal reflux disease without esophagitis: Secondary | ICD-10-CM | POA: Diagnosis not present

## 2021-12-16 DIAGNOSIS — H5213 Myopia, bilateral: Secondary | ICD-10-CM | POA: Diagnosis not present

## 2021-12-16 DIAGNOSIS — H25813 Combined forms of age-related cataract, bilateral: Secondary | ICD-10-CM | POA: Diagnosis not present

## 2022-01-25 DIAGNOSIS — E049 Nontoxic goiter, unspecified: Secondary | ICD-10-CM | POA: Diagnosis not present

## 2022-01-25 DIAGNOSIS — E041 Nontoxic single thyroid nodule: Secondary | ICD-10-CM | POA: Diagnosis not present

## 2022-02-21 DIAGNOSIS — Z1231 Encounter for screening mammogram for malignant neoplasm of breast: Secondary | ICD-10-CM | POA: Diagnosis not present

## 2022-03-16 DIAGNOSIS — E049 Nontoxic goiter, unspecified: Secondary | ICD-10-CM | POA: Diagnosis not present

## 2022-03-16 DIAGNOSIS — N959 Unspecified menopausal and perimenopausal disorder: Secondary | ICD-10-CM | POA: Diagnosis not present

## 2022-03-16 DIAGNOSIS — M899 Disorder of bone, unspecified: Secondary | ICD-10-CM | POA: Diagnosis not present

## 2022-03-16 DIAGNOSIS — R7989 Other specified abnormal findings of blood chemistry: Secondary | ICD-10-CM | POA: Diagnosis not present

## 2022-03-16 DIAGNOSIS — R5383 Other fatigue: Secondary | ICD-10-CM | POA: Diagnosis not present

## 2022-03-16 DIAGNOSIS — E782 Mixed hyperlipidemia: Secondary | ICD-10-CM | POA: Diagnosis not present

## 2022-03-16 DIAGNOSIS — I10 Essential (primary) hypertension: Secondary | ICD-10-CM | POA: Diagnosis not present

## 2022-03-16 DIAGNOSIS — E559 Vitamin D deficiency, unspecified: Secondary | ICD-10-CM | POA: Diagnosis not present

## 2022-03-16 DIAGNOSIS — E039 Hypothyroidism, unspecified: Secondary | ICD-10-CM | POA: Diagnosis not present

## 2022-03-16 DIAGNOSIS — R7309 Other abnormal glucose: Secondary | ICD-10-CM | POA: Diagnosis not present

## 2022-03-16 DIAGNOSIS — E042 Nontoxic multinodular goiter: Secondary | ICD-10-CM | POA: Diagnosis not present

## 2022-03-28 ENCOUNTER — Other Ambulatory Visit: Payer: PPO

## 2022-04-11 DIAGNOSIS — Z85828 Personal history of other malignant neoplasm of skin: Secondary | ICD-10-CM | POA: Diagnosis not present

## 2022-04-11 DIAGNOSIS — L570 Actinic keratosis: Secondary | ICD-10-CM | POA: Diagnosis not present

## 2022-04-11 DIAGNOSIS — L820 Inflamed seborrheic keratosis: Secondary | ICD-10-CM | POA: Diagnosis not present

## 2022-04-11 DIAGNOSIS — C44529 Squamous cell carcinoma of skin of other part of trunk: Secondary | ICD-10-CM | POA: Diagnosis not present

## 2022-04-11 DIAGNOSIS — D485 Neoplasm of uncertain behavior of skin: Secondary | ICD-10-CM | POA: Diagnosis not present

## 2022-05-12 DIAGNOSIS — K140 Glossitis: Secondary | ICD-10-CM | POA: Diagnosis not present

## 2022-05-12 DIAGNOSIS — I10 Essential (primary) hypertension: Secondary | ICD-10-CM | POA: Diagnosis not present

## 2022-05-12 DIAGNOSIS — Z6820 Body mass index (BMI) 20.0-20.9, adult: Secondary | ICD-10-CM | POA: Diagnosis not present

## 2022-05-19 DIAGNOSIS — T162XXA Foreign body in left ear, initial encounter: Secondary | ICD-10-CM | POA: Diagnosis not present

## 2022-06-12 DIAGNOSIS — H5310 Unspecified subjective visual disturbances: Secondary | ICD-10-CM | POA: Diagnosis not present

## 2022-06-12 DIAGNOSIS — H2513 Age-related nuclear cataract, bilateral: Secondary | ICD-10-CM | POA: Diagnosis not present

## 2022-06-12 DIAGNOSIS — H43812 Vitreous degeneration, left eye: Secondary | ICD-10-CM | POA: Diagnosis not present

## 2022-07-24 DIAGNOSIS — L821 Other seborrheic keratosis: Secondary | ICD-10-CM | POA: Diagnosis not present

## 2022-07-24 DIAGNOSIS — D485 Neoplasm of uncertain behavior of skin: Secondary | ICD-10-CM | POA: Diagnosis not present

## 2022-07-24 DIAGNOSIS — Z85828 Personal history of other malignant neoplasm of skin: Secondary | ICD-10-CM | POA: Diagnosis not present

## 2022-07-24 DIAGNOSIS — L433 Subacute (active) lichen planus: Secondary | ICD-10-CM | POA: Diagnosis not present

## 2022-08-22 DIAGNOSIS — M65312 Trigger thumb, left thumb: Secondary | ICD-10-CM | POA: Diagnosis not present

## 2022-08-22 DIAGNOSIS — M65311 Trigger thumb, right thumb: Secondary | ICD-10-CM | POA: Diagnosis not present

## 2022-09-06 DIAGNOSIS — K2100 Gastro-esophageal reflux disease with esophagitis, without bleeding: Secondary | ICD-10-CM | POA: Diagnosis not present

## 2022-09-06 DIAGNOSIS — E559 Vitamin D deficiency, unspecified: Secondary | ICD-10-CM | POA: Diagnosis not present

## 2022-09-06 DIAGNOSIS — E782 Mixed hyperlipidemia: Secondary | ICD-10-CM | POA: Diagnosis not present

## 2022-09-06 DIAGNOSIS — Z79899 Other long term (current) drug therapy: Secondary | ICD-10-CM | POA: Diagnosis not present

## 2022-09-06 DIAGNOSIS — R7989 Other specified abnormal findings of blood chemistry: Secondary | ICD-10-CM | POA: Diagnosis not present

## 2022-09-06 DIAGNOSIS — I10 Essential (primary) hypertension: Secondary | ICD-10-CM | POA: Diagnosis not present

## 2022-09-06 DIAGNOSIS — E039 Hypothyroidism, unspecified: Secondary | ICD-10-CM | POA: Diagnosis not present

## 2022-09-06 DIAGNOSIS — R079 Chest pain, unspecified: Secondary | ICD-10-CM | POA: Diagnosis not present

## 2022-09-06 DIAGNOSIS — R5383 Other fatigue: Secondary | ICD-10-CM | POA: Diagnosis not present

## 2022-09-06 DIAGNOSIS — R7309 Other abnormal glucose: Secondary | ICD-10-CM | POA: Diagnosis not present

## 2022-09-06 DIAGNOSIS — E042 Nontoxic multinodular goiter: Secondary | ICD-10-CM | POA: Diagnosis not present

## 2022-09-06 DIAGNOSIS — N951 Menopausal and female climacteric states: Secondary | ICD-10-CM | POA: Diagnosis not present

## 2022-09-28 DIAGNOSIS — D485 Neoplasm of uncertain behavior of skin: Secondary | ICD-10-CM | POA: Diagnosis not present

## 2022-09-28 DIAGNOSIS — L821 Other seborrheic keratosis: Secondary | ICD-10-CM | POA: Diagnosis not present

## 2022-09-28 DIAGNOSIS — L433 Subacute (active) lichen planus: Secondary | ICD-10-CM | POA: Diagnosis not present

## 2022-09-28 DIAGNOSIS — L570 Actinic keratosis: Secondary | ICD-10-CM | POA: Diagnosis not present

## 2022-09-28 DIAGNOSIS — D045 Carcinoma in situ of skin of trunk: Secondary | ICD-10-CM | POA: Diagnosis not present

## 2022-09-28 DIAGNOSIS — Z85828 Personal history of other malignant neoplasm of skin: Secondary | ICD-10-CM | POA: Diagnosis not present

## 2022-09-29 DIAGNOSIS — G629 Polyneuropathy, unspecified: Secondary | ICD-10-CM | POA: Diagnosis not present

## 2022-09-29 DIAGNOSIS — Z820 Family history of epilepsy and other diseases of the nervous system: Secondary | ICD-10-CM | POA: Diagnosis not present

## 2022-09-29 DIAGNOSIS — G3184 Mild cognitive impairment, so stated: Secondary | ICD-10-CM | POA: Diagnosis not present

## 2022-09-29 DIAGNOSIS — E519 Thiamine deficiency, unspecified: Secondary | ICD-10-CM | POA: Diagnosis not present

## 2022-10-11 DIAGNOSIS — G3184 Mild cognitive impairment, so stated: Secondary | ICD-10-CM | POA: Diagnosis not present

## 2022-10-11 DIAGNOSIS — R419 Unspecified symptoms and signs involving cognitive functions and awareness: Secondary | ICD-10-CM | POA: Diagnosis not present

## 2022-10-11 DIAGNOSIS — R9082 White matter disease, unspecified: Secondary | ICD-10-CM | POA: Diagnosis not present

## 2022-10-23 DIAGNOSIS — N390 Urinary tract infection, site not specified: Secondary | ICD-10-CM | POA: Diagnosis not present

## 2022-10-31 DIAGNOSIS — Z6820 Body mass index (BMI) 20.0-20.9, adult: Secondary | ICD-10-CM | POA: Diagnosis not present

## 2022-10-31 DIAGNOSIS — D519 Vitamin B12 deficiency anemia, unspecified: Secondary | ICD-10-CM | POA: Diagnosis not present

## 2022-10-31 DIAGNOSIS — R799 Abnormal finding of blood chemistry, unspecified: Secondary | ICD-10-CM | POA: Diagnosis not present

## 2022-10-31 DIAGNOSIS — I10 Essential (primary) hypertension: Secondary | ICD-10-CM | POA: Diagnosis not present

## 2022-10-31 DIAGNOSIS — F419 Anxiety disorder, unspecified: Secondary | ICD-10-CM | POA: Diagnosis not present

## 2022-10-31 DIAGNOSIS — N959 Unspecified menopausal and perimenopausal disorder: Secondary | ICD-10-CM | POA: Diagnosis not present

## 2022-11-09 DIAGNOSIS — I6523 Occlusion and stenosis of bilateral carotid arteries: Secondary | ICD-10-CM | POA: Diagnosis not present

## 2022-11-09 DIAGNOSIS — I2510 Atherosclerotic heart disease of native coronary artery without angina pectoris: Secondary | ICD-10-CM | POA: Diagnosis not present

## 2022-11-09 DIAGNOSIS — I10 Essential (primary) hypertension: Secondary | ICD-10-CM | POA: Diagnosis not present

## 2022-11-13 DIAGNOSIS — I10 Essential (primary) hypertension: Secondary | ICD-10-CM | POA: Diagnosis not present

## 2022-12-05 DIAGNOSIS — M65312 Trigger thumb, left thumb: Secondary | ICD-10-CM | POA: Diagnosis not present

## 2022-12-05 DIAGNOSIS — M65311 Trigger thumb, right thumb: Secondary | ICD-10-CM | POA: Diagnosis not present

## 2022-12-18 DIAGNOSIS — H2513 Age-related nuclear cataract, bilateral: Secondary | ICD-10-CM | POA: Diagnosis not present

## 2022-12-18 DIAGNOSIS — H11003 Unspecified pterygium of eye, bilateral: Secondary | ICD-10-CM | POA: Diagnosis not present

## 2022-12-18 DIAGNOSIS — H52203 Unspecified astigmatism, bilateral: Secondary | ICD-10-CM | POA: Diagnosis not present

## 2022-12-19 DIAGNOSIS — R4182 Altered mental status, unspecified: Secondary | ICD-10-CM | POA: Diagnosis not present

## 2022-12-19 DIAGNOSIS — G3184 Mild cognitive impairment, so stated: Secondary | ICD-10-CM | POA: Diagnosis not present

## 2022-12-19 DIAGNOSIS — R413 Other amnesia: Secondary | ICD-10-CM | POA: Diagnosis not present

## 2022-12-25 DIAGNOSIS — L570 Actinic keratosis: Secondary | ICD-10-CM | POA: Diagnosis not present

## 2022-12-25 DIAGNOSIS — C44629 Squamous cell carcinoma of skin of left upper limb, including shoulder: Secondary | ICD-10-CM | POA: Diagnosis not present

## 2022-12-25 DIAGNOSIS — Z85828 Personal history of other malignant neoplasm of skin: Secondary | ICD-10-CM | POA: Diagnosis not present

## 2022-12-25 DIAGNOSIS — D485 Neoplasm of uncertain behavior of skin: Secondary | ICD-10-CM | POA: Diagnosis not present

## 2023-01-02 DIAGNOSIS — M65312 Trigger thumb, left thumb: Secondary | ICD-10-CM | POA: Diagnosis not present

## 2023-01-02 DIAGNOSIS — M65311 Trigger thumb, right thumb: Secondary | ICD-10-CM | POA: Diagnosis not present

## 2023-01-11 DIAGNOSIS — Z6820 Body mass index (BMI) 20.0-20.9, adult: Secondary | ICD-10-CM | POA: Diagnosis not present

## 2023-01-11 DIAGNOSIS — N959 Unspecified menopausal and perimenopausal disorder: Secondary | ICD-10-CM | POA: Diagnosis not present

## 2023-01-11 DIAGNOSIS — E039 Hypothyroidism, unspecified: Secondary | ICD-10-CM | POA: Diagnosis not present

## 2023-01-11 DIAGNOSIS — F419 Anxiety disorder, unspecified: Secondary | ICD-10-CM | POA: Diagnosis not present

## 2023-01-11 DIAGNOSIS — I10 Essential (primary) hypertension: Secondary | ICD-10-CM | POA: Diagnosis not present

## 2023-01-16 DIAGNOSIS — E039 Hypothyroidism, unspecified: Secondary | ICD-10-CM | POA: Diagnosis not present

## 2023-01-24 DIAGNOSIS — G3184 Mild cognitive impairment, so stated: Secondary | ICD-10-CM | POA: Diagnosis not present

## 2023-02-20 DIAGNOSIS — G3184 Mild cognitive impairment, so stated: Secondary | ICD-10-CM | POA: Diagnosis not present

## 2023-03-05 DIAGNOSIS — I10 Essential (primary) hypertension: Secondary | ICD-10-CM | POA: Diagnosis not present

## 2023-03-05 DIAGNOSIS — E782 Mixed hyperlipidemia: Secondary | ICD-10-CM | POA: Diagnosis not present

## 2023-03-05 DIAGNOSIS — I7789 Other specified disorders of arteries and arterioles: Secondary | ICD-10-CM | POA: Diagnosis not present

## 2023-04-04 DIAGNOSIS — L821 Other seborrheic keratosis: Secondary | ICD-10-CM | POA: Diagnosis not present

## 2023-04-04 DIAGNOSIS — C44529 Squamous cell carcinoma of skin of other part of trunk: Secondary | ICD-10-CM | POA: Diagnosis not present

## 2023-04-04 DIAGNOSIS — L433 Subacute (active) lichen planus: Secondary | ICD-10-CM | POA: Diagnosis not present

## 2023-04-04 DIAGNOSIS — D485 Neoplasm of uncertain behavior of skin: Secondary | ICD-10-CM | POA: Diagnosis not present

## 2023-04-04 DIAGNOSIS — Z85828 Personal history of other malignant neoplasm of skin: Secondary | ICD-10-CM | POA: Diagnosis not present

## 2023-04-04 DIAGNOSIS — L570 Actinic keratosis: Secondary | ICD-10-CM | POA: Diagnosis not present

## 2023-04-10 DIAGNOSIS — I10 Essential (primary) hypertension: Secondary | ICD-10-CM | POA: Diagnosis not present

## 2023-04-10 DIAGNOSIS — I7789 Other specified disorders of arteries and arterioles: Secondary | ICD-10-CM | POA: Diagnosis not present

## 2023-04-27 DIAGNOSIS — Z1231 Encounter for screening mammogram for malignant neoplasm of breast: Secondary | ICD-10-CM | POA: Diagnosis not present

## 2023-05-03 DIAGNOSIS — D692 Other nonthrombocytopenic purpura: Secondary | ICD-10-CM | POA: Diagnosis not present

## 2023-05-03 DIAGNOSIS — L570 Actinic keratosis: Secondary | ICD-10-CM | POA: Diagnosis not present

## 2023-05-03 DIAGNOSIS — Z85828 Personal history of other malignant neoplasm of skin: Secondary | ICD-10-CM | POA: Diagnosis not present

## 2023-05-03 DIAGNOSIS — C44722 Squamous cell carcinoma of skin of right lower limb, including hip: Secondary | ICD-10-CM | POA: Diagnosis not present

## 2023-05-03 DIAGNOSIS — D485 Neoplasm of uncertain behavior of skin: Secondary | ICD-10-CM | POA: Diagnosis not present

## 2023-05-23 DIAGNOSIS — C44722 Squamous cell carcinoma of skin of right lower limb, including hip: Secondary | ICD-10-CM | POA: Diagnosis not present

## 2023-05-23 DIAGNOSIS — Z85828 Personal history of other malignant neoplasm of skin: Secondary | ICD-10-CM | POA: Diagnosis not present

## 2023-05-23 DIAGNOSIS — D0462 Carcinoma in situ of skin of left upper limb, including shoulder: Secondary | ICD-10-CM | POA: Diagnosis not present

## 2023-05-23 DIAGNOSIS — D485 Neoplasm of uncertain behavior of skin: Secondary | ICD-10-CM | POA: Diagnosis not present

## 2023-05-23 DIAGNOSIS — L280 Lichen simplex chronicus: Secondary | ICD-10-CM | POA: Diagnosis not present

## 2023-05-23 DIAGNOSIS — L433 Subacute (active) lichen planus: Secondary | ICD-10-CM | POA: Diagnosis not present

## 2023-06-05 DIAGNOSIS — L433 Subacute (active) lichen planus: Secondary | ICD-10-CM | POA: Diagnosis not present

## 2023-07-03 DIAGNOSIS — C4441 Basal cell carcinoma of skin of scalp and neck: Secondary | ICD-10-CM | POA: Diagnosis not present

## 2023-07-03 DIAGNOSIS — L433 Subacute (active) lichen planus: Secondary | ICD-10-CM | POA: Diagnosis not present

## 2023-07-03 DIAGNOSIS — D485 Neoplasm of uncertain behavior of skin: Secondary | ICD-10-CM | POA: Diagnosis not present

## 2023-07-03 DIAGNOSIS — L820 Inflamed seborrheic keratosis: Secondary | ICD-10-CM | POA: Diagnosis not present

## 2023-07-03 DIAGNOSIS — Z79899 Other long term (current) drug therapy: Secondary | ICD-10-CM | POA: Diagnosis not present

## 2023-07-03 DIAGNOSIS — Z85828 Personal history of other malignant neoplasm of skin: Secondary | ICD-10-CM | POA: Diagnosis not present

## 2023-07-04 DIAGNOSIS — E039 Hypothyroidism, unspecified: Secondary | ICD-10-CM | POA: Diagnosis not present

## 2023-07-04 DIAGNOSIS — N959 Unspecified menopausal and perimenopausal disorder: Secondary | ICD-10-CM | POA: Diagnosis not present

## 2023-07-04 DIAGNOSIS — M791 Myalgia, unspecified site: Secondary | ICD-10-CM | POA: Diagnosis not present

## 2023-07-04 DIAGNOSIS — Z0000 Encounter for general adult medical examination without abnormal findings: Secondary | ICD-10-CM | POA: Diagnosis not present

## 2023-07-04 DIAGNOSIS — Z6820 Body mass index (BMI) 20.0-20.9, adult: Secondary | ICD-10-CM | POA: Diagnosis not present

## 2023-07-04 DIAGNOSIS — M1990 Unspecified osteoarthritis, unspecified site: Secondary | ICD-10-CM | POA: Diagnosis not present

## 2023-07-04 DIAGNOSIS — I10 Essential (primary) hypertension: Secondary | ICD-10-CM | POA: Diagnosis not present

## 2023-07-04 DIAGNOSIS — F419 Anxiety disorder, unspecified: Secondary | ICD-10-CM | POA: Diagnosis not present
